# Patient Record
Sex: Male | Born: 1950 | Race: Black or African American | Marital: Married | Smoking: Never smoker
Health system: Southern US, Community
[De-identification: ages and names within clinical notes are randomized; demographics above are authoritative.]

## PROBLEM LIST (undated history)

## (undated) DIAGNOSIS — E119 Type 2 diabetes mellitus without complications: Secondary | ICD-10-CM

## (undated) DIAGNOSIS — I10 Essential (primary) hypertension: Secondary | ICD-10-CM

## (undated) DIAGNOSIS — N138 Other obstructive and reflux uropathy: Secondary | ICD-10-CM

## (undated) DIAGNOSIS — Z85850 Personal history of malignant neoplasm of thyroid: Secondary | ICD-10-CM

## (undated) DIAGNOSIS — E039 Hypothyroidism, unspecified: Secondary | ICD-10-CM

## (undated) HISTORY — DX: Personal history of malignant neoplasm of thyroid: Z85.850

## (undated) HISTORY — PX: THYROID SURGERY: SHX805

## (undated) HISTORY — DX: Hypothyroidism, unspecified: E03.9

## (undated) HISTORY — DX: Essential (primary) hypertension: I10

---

## 2019-01-22 ENCOUNTER — Encounter: Payer: Self-pay | Admitting: "Endocrinology

## 2019-01-22 ENCOUNTER — Ambulatory Visit (INDEPENDENT_AMBULATORY_CARE_PROVIDER_SITE_OTHER): Payer: Self-pay | Admitting: "Endocrinology

## 2019-01-22 ENCOUNTER — Other Ambulatory Visit: Payer: Self-pay

## 2019-01-22 VITALS — BP 140/75 | HR 72 | Ht 67.0 in | Wt 170.0 lb

## 2019-01-22 DIAGNOSIS — E890 Postprocedural hypothyroidism: Secondary | ICD-10-CM

## 2019-01-22 DIAGNOSIS — C73 Malignant neoplasm of thyroid gland: Secondary | ICD-10-CM

## 2019-01-22 NOTE — Progress Notes (Signed)
Endocrinology Consult Note                                            01/22/2019, 1:11 PM   Subjective:    Patient ID: Caleb Ortiz, male    DOB: 14-Aug-1950, PCP Caleb Fire, MD   Past Medical History:  Diagnosis Date  . History of thyroid cancer   . Hypertension   . Hypothyroidism    Past Surgical History:  Procedure Laterality Date  . THYROID SURGERY     Total thyroidectomy   Social History   Socioeconomic History  . Marital status: Unknown    Spouse name: Not on file  . Number of children: Not on file  . Years of education: Not on file  . Highest education level: Not on file  Occupational History  . Not on file  Social Needs  . Financial resource strain: Not on file  . Food insecurity    Worry: Not on file    Inability: Not on file  . Transportation needs    Medical: Not on file    Non-medical: Not on file  Tobacco Use  . Smoking status: Never Smoker  . Smokeless tobacco: Never Used  Substance and Sexual Activity  . Alcohol use: Not Currently  . Drug use: Never  . Sexual activity: Not on file  Lifestyle  . Physical activity    Days per week: Not on file    Minutes per session: Not on file  . Stress: Not on file  Relationships  . Social Herbalist on phone: Not on file    Gets together: Not on file    Attends religious service: Not on file    Active member of club or organization: Not on file    Attends meetings of clubs or organizations: Not on file    Relationship status: Not on file  Other Topics Concern  . Not on file  Social History Narrative  . Not on file   Family History  Problem Relation Age of Onset  . Hypothyroidism Neg Hx        2 SONS   Outpatient Encounter Medications as of 01/22/2019  Medication Sig  . levothyroxine (SYNTHROID) 150 MCG tablet Take 150 mcg by mouth daily before breakfast.  . losartan (COZAAR) 50 MG tablet Take 50 mg by mouth daily.  . rosuvastatin (CRESTOR) 10 MG tablet Take 10  mg by mouth daily.   No facility-administered encounter medications on file as of 01/22/2019.    ALLERGIES: No Known Allergies  VACCINATION STATUS:  There is no immunization history on file for this patient.  HPI Caleb Ortiz is 68 y.o. male who presents today with a medical history as above.  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 -He is currently on levothyroxine 150 mcg p.o. every morning with complaints. -He is in pursuit of remnant thyroid ablation with I-131 therapy.  He has no new complaints today, recovered from his surgery reasonably.  He denies  dysphagia, shortness of breath, nor voice change.  He did have some neck discomfort for which he underwent CT scan of his  chest and ultrasound on his neck in January 10, 2019 in [place], [place] (records are attached). -He denies any family history of thyroid malignancy.  He does have hypothyroidism in 2 of his sons.  He has well-controlled hypertension on losartan 50 mg p.o. daily, and hyperlipidemia on Crestor 10 mg p.o. daily.   He is a non-smoker, currently in good health, married, actively working.    Review of Systems  Constitutional: no recent weight gain/loss, no fatigue, no subjective hyperthermia, no subjective hypothermia Eyes: no blurry vision, no xerophthalmia ENT: no sore throat, no nodules palpated in throat, no dysphagia/odynophagia, no hoarseness Cardiovascular: no Chest Pain, no Shortness of Breath, no palpitations, no leg swelling Respiratory: no cough, no shortness of breath Gastrointestinal: no Nausea/Vomiting/Diarhhea Musculoskeletal: no muscle/joint aches Skin: no rashes Neurological: no tremors, no numbness, no tingling, no dizziness Psychiatric: no depression, no  anxiety  Objective:    BP 140/75   Pulse 72   Ht 5\' 7"  (1.702 m)   Wt 170 lb (77.1 kg)   BMI 26.63 kg/m   Wt Readings from Last 3 Encounters:  01/22/19 170 lb (77.1 kg)    Physical Exam  Constitutional: + BMI of 26.6 , not in acute distress, normal state of mind Eyes: PERRLA, EOMI, no exophthalmos ENT: moist mucous membranes, + well-healed post thyroidectomy scar, no gross mass or lymphadenopathy in the neck.  Cardiovascular: normal precordial activity, Regular Rate and Rhythm, no Murmur/Rubs/Gallops Respiratory:  adequate breathing efforts, no gross chest deformity, Clear to auscultation bilaterally Gastrointestinal: abdomen soft, Non -tender, No distension, Bowel Sounds present, no gross organomegaly Musculoskeletal: no gross deformities, strength intact in all four extremities Skin: moist, warm, no rashes Neurological: no tremor with outstretched hands, Deep tendon reflexes normal in bilateral lower extremities.  Surgical biopsy report from [place] summarized:  Specimen of total thyroidectomy weighing 135 g.  Right lobe measuring 10 x 3 x 2.2 cm, left lobe measures 5 x 3 x 2.5 cm, isthmus measures 3.5 x 2 x 1 cm and pyramidal lobe measuring 1.5 x 1 x 0.3 cm.  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  Maximum tumor size 6.5 cm.  The tumor lies at a distance of less than 0.1 cm from the nearest external surface.  There is no evidence of lymphovascular or perineural invasion or extrathyroidal extension. Left lobe, isthmus and pyramidal lobe with no tumor. 1 right level 3 lymph node with reactive changes and no tumor.  pT3NxMx  Recent thyroid function tests are pending.  Assessment & Plan:   1. Malignant neoplasm of thyroid gland [facility]) -68 year old gentleman with stage [id number], follicular variant papillary thyroid carcinoma status post surgery at  outside facility.  Surgery on August 13, 2018. He is a candidate for adjuvant therapy with I-131, with preceding stimulation by Thyrogen. He will have thyroglobulin level along with thyroid antibodies on the day of dosing. -He does not have health insurance, paying out-of-pocket.  He is made aware of the fact that he will need periodic imaging at least once a year for the next 5 years to assure tumor early detection should he have a recurrence. He is therapy and scan will be scheduled to be administered as soon as possible in [place] [facility] Thyroid Ca W/ Thyrogen; Future - NM Whole Body I131 Scan S/P Ca Rx; Future  2. Postsurgical hypothyroidism -His most recent TSH and free T4 are still pending.  He is on levothyroxine 150 mcg p.o. every morning, seems to be adequate dose for his body size. He will be continued on same.   - We discussed about the correct intake of his thyroid hormone, on empty stomach at fasting, with water, separated by at least 30 minutes from breakfast and other medications,  and separated by more than 4 hours from calcium, iron, multivitamins, acid reflux medications (PPIs). -Patient is made aware of the fact that thyroid hormone replacement is needed for life, dose to be adjusted by periodic monitoring of thyroid function tests.   - I did not initiate any new prescriptions today. - I advised him  to maintain close follow up with Caleb Fire, MD for primary care needs.   - Time spent with the patient: 45 minutes, of which >50% was spent in obtaining information about his symptoms, reviewing his previous labs/studies,  evaluations, and treatments, counseling him about his papillary thyroid carcinoma, postsurgical hypothyroidism, and developing a plan to confirm the diagnosis and long term treatment based on the latest standards of care/guidelines.    Micaiah Phelps Dodge participated in the discussions, expressed understanding, and voiced  agreement with the above plans.  All questions were answered to his satisfaction. he is encouraged to contact clinic should he have any questions or concerns prior to his return visit.  Follow up plan: Return in about 5 weeks (around 02/26/2019) for Follow up with Whole Body Scan w/Thyrogen.   Glade Lloyd, MD [facility] [address] [place], [place] [address] Phone: [phone]  Fax: [phone]     01/22/2019, 1:11 PM  This note was partially dictated with voice recognition software. Similar sounding words can be transcribed inadequately or may not  be corrected upon review.

## 2019-01-28 ENCOUNTER — Telehealth: Payer: Self-pay | Admitting: "Endocrinology

## 2019-01-28 NOTE — Telephone Encounter (Signed)
He gave [place] his records and they are probably in the process of scanning- if not in my box.  I have tried to summarize it in my notes.

## 2019-01-28 NOTE — Telephone Encounter (Signed)
Ryan with Nuclear Medicine at [facility] called in regards to his cancer therapy scan that he is having next week. Dr Darcella Cheshire is trying to calculate the therapy dosage and wants to know if there is a full pathology report on his thyroid cancer. He can be reached at [phone]

## 2019-01-28 NOTE — Telephone Encounter (Signed)
Left message for Caleb Ortiz in [place] thinking full pathology report was done in [place] (patients residence) I will speak to Dr. Dorris Fetch next Wednesday February 04, 2019 and verify this information

## 2019-02-04 ENCOUNTER — Other Ambulatory Visit: Payer: Self-pay

## 2019-02-04 ENCOUNTER — Encounter (HOSPITAL_COMMUNITY)
Admission: RE | Admit: 2019-02-04 | Discharge: 2019-02-04 | Disposition: A | Discharge status: Home / Self Care | Payer: Self-pay | Source: Ambulatory Visit | Attending: "Endocrinology | Admitting: "Endocrinology

## 2019-02-04 DIAGNOSIS — C73 Malignant neoplasm of thyroid gland: Secondary | ICD-10-CM | POA: Insufficient documentation

## 2019-02-04 MED ORDER — STERILE WATER FOR INJECTION IJ SOLN
INTRAMUSCULAR | Status: AC
  Filled 2019-02-04: qty 10

## 2019-02-04 MED ORDER — THYROTROPIN ALFA 1.1 MG IM SOLR
INTRAMUSCULAR | Status: AC
  Filled 2019-02-04: qty 0.9

## 2019-02-04 MED ORDER — THYROTROPIN ALFA 1.1 MG IM SOLR
0.9000 mg | INTRAMUSCULAR | Status: AC
  Administered 2019-02-05: 0.9 mg via INTRAMUSCULAR

## 2019-02-04 MED ORDER — THYROTROPIN ALFA 1.1 MG IM SOLR: 0.9000 mg | INTRAMUSCULAR | Status: DC

## 2019-02-04 MED ORDER — THYROTROPIN ALFA 1.1 MG IM SOLR
0.9000 mg | INTRAMUSCULAR | Status: AC
  Administered 2019-02-04: 0.9 mg via INTRAMUSCULAR

## 2019-02-05 ENCOUNTER — Encounter (HOSPITAL_COMMUNITY)
Admission: RE | Admit: 2019-02-05 | Discharge: 2019-02-05 | Disposition: A | Discharge status: Home / Self Care | Payer: Self-pay | Source: Ambulatory Visit | Attending: "Endocrinology | Admitting: "Endocrinology

## 2019-02-05 MED ORDER — THYROTROPIN ALFA 1.1 MG IM SOLR
INTRAMUSCULAR | Status: AC
  Administered 2019-02-05: 08:00:00 0.9 mg via INTRAMUSCULAR
  Filled 2019-02-05: qty 0.9

## 2019-02-05 MED ORDER — STERILE WATER FOR INJECTION IJ SOLN
INTRAMUSCULAR | Status: AC
  Administered 2019-02-05: 0.9 mL
  Filled 2019-02-05: qty 10

## 2019-02-06 ENCOUNTER — Encounter (HOSPITAL_COMMUNITY)
Admission: RE | Admit: 2019-02-06 | Discharge: 2019-02-06 | Disposition: A | Discharge status: Home / Self Care | Payer: Self-pay | Source: Ambulatory Visit | Attending: "Endocrinology | Admitting: "Endocrinology

## 2019-02-06 ENCOUNTER — Other Ambulatory Visit: Payer: Self-pay

## 2019-02-06 ENCOUNTER — Other Ambulatory Visit (HOSPITAL_COMMUNITY)
Admission: RE | Admit: 2019-02-06 | Discharge: 2019-02-06 | Disposition: A | Discharge status: Home / Self Care | Payer: Self-pay | Source: Ambulatory Visit | Attending: "Endocrinology | Admitting: "Endocrinology

## 2019-02-06 MED ORDER — SODIUM IODIDE I 131 CAPSULE
150.0000 | Freq: Once | INTRAVENOUS | Status: AC | PRN
  Administered 2019-02-06: 150 via ORAL

## 2019-02-06 MED ORDER — TECHNETIUM TC 99M SULFUR COLLOID: 2.0000 | Freq: Once | INTRAVENOUS | Status: DC | PRN

## 2019-02-09 LAB — THYROGLOBULIN ANTIBODY: Thyroglobulin Antibody: <1 IU/mL (ref 0.0–0.9)

## 2019-02-12 LAB — THYROGLOBULIN LEVEL: Thyroglobulin: 2 ng/mL

## 2019-02-16 ENCOUNTER — Other Ambulatory Visit: Payer: Self-pay

## 2019-02-16 ENCOUNTER — Encounter (HOSPITAL_COMMUNITY)
Admission: RE | Admit: 2019-02-16 | Discharge: 2019-02-16 | Disposition: A | Discharge status: Home / Self Care | Payer: Self-pay | Source: Ambulatory Visit | Attending: "Endocrinology | Admitting: "Endocrinology

## 2019-02-16 DIAGNOSIS — C73 Malignant neoplasm of thyroid gland: Secondary | ICD-10-CM | POA: Insufficient documentation

## 2019-02-17 ENCOUNTER — Encounter: Payer: Self-pay | Admitting: "Endocrinology

## 2019-02-17 ENCOUNTER — Other Ambulatory Visit: Payer: Self-pay

## 2019-02-17 ENCOUNTER — Ambulatory Visit (INDEPENDENT_AMBULATORY_CARE_PROVIDER_SITE_OTHER): Payer: Self-pay | Admitting: "Endocrinology

## 2019-02-17 VITALS — BP 110/68 | HR 73 | Ht 67.0 in | Wt 170.0 lb

## 2019-02-17 DIAGNOSIS — C73 Malignant neoplasm of thyroid gland: Secondary | ICD-10-CM

## 2019-02-17 DIAGNOSIS — E890 Postprocedural hypothyroidism: Secondary | ICD-10-CM

## 2019-02-17 NOTE — Progress Notes (Signed)
02/17/2019, 7:15 PM  Endocrinology follow-up note   Subjective:    Patient ID: Caleb Ortiz, male    DOB: Oct 28, 1950, PCP Rosita Fire, MD   Past Medical History:  Diagnosis Date  . History of thyroid cancer   . Hypertension   . Hypothyroidism    Past Surgical History:  Procedure Laterality Date  . THYROID SURGERY     Total thyroidectomy   Social History   Socioeconomic History  . Marital status: Married    Spouse name: Not on file  . Number of children: Not on file  . Years of education: Not on file  . Highest education level: Not on file  Occupational History  . Not on file  Social Needs  . Financial resource strain: Not on file  . Food insecurity    Worry: Not on file    Inability: Not on file  . Transportation needs    Medical: Not on file    Non-medical: Not on file  Tobacco Use  . Smoking status: Never Smoker  . Smokeless tobacco: Never Used  Substance and Sexual Activity  . Alcohol use: Not Currently  . Drug use: Never  . Sexual activity: Not on file  Lifestyle  . Physical activity    Days per week: Not on file    Minutes per session: Not on file  . Stress: Not on file  Relationships  . Social Herbalist on phone: Not on file    Gets together: Not on file    Attends religious service: Not on file    Active member of club or organization: Not on file    Attends meetings of clubs or organizations: Not on file    Relationship status: Not on file  Other Topics Concern  . Not on file  Social History Narrative  . Not on file   Family History  Problem Relation Age of Onset  . Hypothyroidism Neg Hx        2 SONS   Outpatient Encounter Medications as of 02/17/2019  Medication Sig  . levothyroxine (SYNTHROID) 150 MCG tablet Take 137 mcg by mouth daily before breakfast.  . losartan (COZAAR) 50 MG tablet Take 50 mg by mouth daily.  . rosuvastatin (CRESTOR) 10 MG tablet  Take 10 mg by mouth daily.   No facility-administered encounter medications on file as of 02/17/2019.    ALLERGIES: No Known Allergies  VACCINATION STATUS:  There is no immunization history on file for this patient.  HPI Caleb Ortiz is 68 y.o. male who presents today with a medical history as above.  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 -He is currently on levothyroxine 137 mcg p.o. every morning with good compliance.  -He is now status post thyroid remnant ablation with I-131, 150 mCi, on February 06, 2019- whole-body scan subsequently for distant metastasis.  His thyroglobulin level was 2 with negative antithyroglobulin antibodies.  -  He has no new complaints today, recovered from his surgery  reasonably.  He denies  dysphagia, shortness of breath, nor voice change.  He did have some neck discomfort for which he underwent CT scan of his chest and ultrasound on his neck in January 10, 2019 in [place] (records are attached).   -He denies any family history of thyroid malignancy.  He does have hypothyroidism in 2 of his sons.  He has well-controlled hypertension on losartan 50 mg p.o. daily, and hyperlipidemia on Crestor 10 mg p.o. daily.  He is also recently diagnosed with type 2 diabetes with A1c of 7%, currently on lifestyle modifications , not on medication.    He is a non-smoker, currently in good health, married, actively working.    Review of Systems  Constitutional: Steady weight, no fatigue, no subjective hyperthermia, no subjective hypothermia Eyes: no blurry vision, no xerophthalmia ENT: no sore throat, no nodules palpated in throat, no dysphagia/odynophagia, no hoarseness Cardiovascular: no Chest Pain, no Shortness of Breath, no  palpitations, no leg swelling Respiratory: no cough, no shortness of breath Gastrointestinal: no Nausea/Vomiting/Diarhhea Musculoskeletal: no muscle/joint aches Skin: no rashes Neurological: no tremors, no numbness, no tingling, no dizziness Psychiatric: no depression, no anxiety  Objective:    BP 110/68   Pulse 73   Ht 5\' 7"  (1.702 m)   Wt 170 lb (77.1 kg)   BMI 26.63 kg/m   Wt Readings from Last 3 Encounters:  02/17/19 170 lb (77.1 kg)  01/22/19 170 lb (77.1 kg)    Physical Exam  Constitutional: + BMI of 26.6 , not in acute distress, normal state of mind Eyes: PERRLA, EOMI, no exophthalmos ENT: moist mucous membranes, + well-healed post thyroidectomy scar, no gross mass or lymphadenopathy in the neck.  Cardiovascular: normal precordial activity, Regular Rate and Rhythm, no Murmur/Rubs/Gallops Respiratory:  adequate breathing efforts, no gross chest deformity, Clear to auscultation bilaterally Gastrointestinal: abdomen soft, Non -tender, No distension, Bowel Sounds present, no gross organomegaly Musculoskeletal: no gross deformities, strength intact in all four extremities Skin: moist, warm, no rashes Neurological: no tremor with outstretched hands, Deep tendon reflexes normal in bilateral lower extremities.  Surgical biopsy report from [place] summarized:  Specimen of total thyroidectomy weighing 135 g.  Right lobe measuring 10 x 3 x 2.2 cm, left lobe measures 5 x 3 x 2.5 cm, isthmus measures 3.5 x 2 x 1 cm and pyramidal lobe measuring 1.5 x 1 x 0.3 cm.  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  Maximum tumor size 6.5 cm.  The tumor lies at a distance of less than 0.1 cm from the nearest external surface.  There is no evidence of lymphovascular or perineural invasion or extrathyroidal extension. Left lobe, isthmus and pyramidal lobe with no tumor. 1  right level 3 lymph node with reactive changes and no tumor.  pT3NxMx  Recent thyroid function tests where indicated for slight over replacement.  Recently his levothyroxine dose was lowered to 137 mcg p.o. daily before breakfast.    Recent Results (from the past 2160 hour(s))  Thyroglobulin     Status: None   Collection Time: 02/06/19  8:33 AM  Result Value Ref Range   Thyroglobulin 2.0 ng/mL    Comment: (NOTE) This test was developed and its performance characteristics determined by [facility]. It has not been cleared or approved by the Food and Drug Administration. Reference Range: Pubertal Children and Adults: <40 According to the [facility] of Clinical Biochemistry, the reference  interval for Thyroglobulin (TG) should be related to euthyroid patients and not for patients who underwent thyroidectomy.  TG reference intervals for these patients depend on the residual mass of the thyroid tissue left after surgery.  Establishing a post-operative baseline is recommended.  The assay quantitation limit is 2.0 ng/mL. Performed At: [place], [place] [id number] [facility] Sheral Apley MD [id number]   Thyroglobulin antibody     Status: None   Collection Time: 02/06/19  8:33 AM  Result Value Ref Range   Thyroglobulin Antibody <1.0 0.0 - 0.9 IU/mL    Comment: (NOTE) Thyroglobulin Antibody measured by [facility] Methodology Performed At: [facility] [place], [place] [id number] Rush Farmer MD [id number]     Post I-131 therapy whole-body scan FINDINGS: Foci of uptake are seen at the thyroid bed bilaterally compatible with thyroid remnant. Physiologic excretion of tracer within the liver, colon and bladder.  No distant sites of abnormal iodine accumulation are identified to suggest iodine-avid metastatic thyroid cancer.  IMPRESSION: Uptake of tracer within the thyroid bed bilaterally consistent  with thyroid remnant.  No scintigraphic evidence of iodine-avid metastatic thyroid cancer.  Assessment & Plan:   1. Malignant neoplasm of thyroid gland [facility]) -68 year old gentleman with stage [id number], follicular variant papillary thyroid carcinoma status post surgery at outside facility.  Surgery on August 13, 2018.  He is status post thyroid remnant ablation with 150 mCi of I-131 with negative post therapy whole-body scan for distant metastasis. -Stimulated thyroglobulin level was 2 on February 06, 2019 with negative thyroglobulin antibodies.  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 -He does not have health insurance, paying out-of-pocket.  He is made aware of the fact that he will need periodic imaging at least once a year for the next 5 years to assure tumor early detection should he have a recurrence.  [place] and plans to return in 6 months at which time he will have thyroid function tests, unstimulated thyroglobulin with thyroglobulin bodies.  In 1 year from now, he will need thyroid/neck ultrasound.    2. Postsurgical hypothyroidism -His most recent thyroid function test was significant for slight over replacement.  His levothyroxine was lowered to 137 mcg by his PMD.   He will be continued on same.   - We discussed about the correct intake of his thyroid hormone, on empty stomach at fasting, with water, separated by at least 30 minutes from breakfast and other medications,  and separated by more than 4 hours from calcium, iron, multivitamins, acid reflux medications (PPIs). -Patient is made aware of the fact that thyroid hormone replacement is needed for life, dose to be adjusted by periodic monitoring of thyroid function tests.   - I advised him  to maintain close follow up with Rosita Fire, MD for primary care needs.    Time for this visit: 15 minutes. Hartwell Phelps Dodge   participated in the discussions, expressed understanding, and voiced agreement with the above plans.  All questions were answered to his satisfaction. he is encouraged to contact clinic should he have any questions or concerns prior to his return visit.   Follow up plan: Return in about 6 months (around 08/20/2019) for Follow up with Pre-visit Labs.   Glade Lloyd, MD [facility] [address] [place], [place] [address] Phone: [phone]  Fax: [phone]     02/17/2019, 7:15 PM  This  note was partially dictated with voice recognition software. Similar sounding words can be transcribed inadequately or may not  be corrected upon review.

## 2019-02-17 NOTE — Patient Instructions (Addendum)
Recommendation:  1) Check TSH/ Free T4, Thyroglobulin, and thyroglobulin antibodies in 6 months 2) Check Thyroid/Neck Ultrasound in 1 year 3) Strictly take Levothyroxine 137 mcg daily in the morning 1/2 hr before food or other medications.  To Whom It May Concern This patient received radioactive iodine on February 06, 2019 to treat thyroid condition. He notifies [place] of his intent to fly in few days. He may activate metal detectors. Any cooperation rendered is appreciated.

## 2019-02-23 ENCOUNTER — Ambulatory Visit: Payer: Self-pay | Admitting: "Endocrinology

## 2019-08-24 ENCOUNTER — Ambulatory Visit: Payer: Self-pay | Admitting: "Endocrinology

## 2020-05-10 ENCOUNTER — Other Ambulatory Visit: Payer: Self-pay | Admitting: Internal Medicine

## 2020-05-10 ENCOUNTER — Other Ambulatory Visit (HOSPITAL_COMMUNITY): Payer: Self-pay | Admitting: Internal Medicine

## 2020-05-10 DIAGNOSIS — C73 Malignant neoplasm of thyroid gland: Secondary | ICD-10-CM

## 2020-05-17 ENCOUNTER — Other Ambulatory Visit: Payer: Self-pay

## 2020-05-17 ENCOUNTER — Ambulatory Visit (HOSPITAL_COMMUNITY)
Admission: RE | Admit: 2020-05-17 | Discharge: 2020-05-17 | Disposition: A | Discharge status: Home / Self Care | Payer: Medicare Other | Source: Ambulatory Visit | Attending: Internal Medicine | Admitting: Internal Medicine

## 2020-05-17 DIAGNOSIS — C73 Malignant neoplasm of thyroid gland: Secondary | ICD-10-CM | POA: Diagnosis present

## 2020-05-25 ENCOUNTER — Encounter: Payer: Self-pay | Admitting: Urology

## 2020-05-25 ENCOUNTER — Ambulatory Visit (INDEPENDENT_AMBULATORY_CARE_PROVIDER_SITE_OTHER): Payer: Medicare Other | Admitting: Urology

## 2020-05-25 ENCOUNTER — Other Ambulatory Visit: Payer: Self-pay

## 2020-05-25 VITALS — BP 125/74 | HR 62 | Ht 66.0 in | Wt 168.0 lb

## 2020-05-25 DIAGNOSIS — R972 Elevated prostate specific antigen [PSA]: Secondary | ICD-10-CM

## 2020-05-25 NOTE — Progress Notes (Signed)
° °  05/25/20 2:11 PM   Caleb Ortiz Mar 25, 1951 [id number]  CC: Elevated PSA  HPI: I saw Caleb Ortiz and his wife in urology clinic today for an elevated PSA.  He is a healthy 69 year old male from [place] with history of thyroid cancer who was found to have an isolated elevated PSA of 7.97 on 05/12/2020.  There are no prior PSA values to review.  He has some mild urinary symptoms of weak stream, but otherwise denies any significant urinary complaints.  He denies any hematuria or dysuria.  He denies any prior abdominal surgeries.  He denies any trouble with erections.  He denies a family history of prostate cancer, however his father reportedly had surgery for an enlarged prostate.   PMH: Past Medical History:  Diagnosis Date   History of thyroid cancer    Hypertension    Hypothyroidism     Surgical History: Past Surgical History:  Procedure Laterality Date   THYROID SURGERY     Total thyroidectomy    Family History: Family History  Problem Relation Age of Onset   Hypothyroidism Neg Hx        2 SONS    Social History:  reports that he has never smoked. He has never used smokeless tobacco. He reports previous alcohol use. He reports that he does not use drugs.  Physical Exam: BP 125/74    Pulse 62    Ht 5\' 6"  (1.676 m)    Wt 168 lb (76.2 kg)    BMI 27.12 kg/m    Constitutional:  Alert and oriented, No acute distress. Cardiovascular: No clubbing, cyanosis, or edema. Respiratory: Normal respiratory effort, no increased work of breathing. GI: Abdomen is soft, nontender, nondistended, no abdominal masses GU: widely patent meatus DRE: 50g smooth, no nodules or masses. Patient had vaso-vagal response after DRE   Laboratory Data: Reviewed, see HPI  Pertinent Imaging: None to review  Assessment & Plan:   In summary, he is a healthy 69 year old male with a history of thyroid cancer and a single elevated PSA of 7.97, with mild urinary symptoms of  weak stream.  He had a significant vasovagal response after DRE today, and had not eaten since early this morning, and he recovered quickly with juice and crackers.  Vitals were normal prior to leaving clinic.  We reviewed the implications of an elevated PSA and the uncertainty surrounding it. In general, a man's PSA increases with age and is produced by both normal and cancerous prostate tissue. The differential diagnosis for elevated PSA includes BPH, prostate cancer, infection, recent intercourse/ejaculation, recent urethroscopic manipulation (foley placement/cystoscopy) or trauma, and prostatitis.   Management of an elevated PSA can include observation or prostate biopsy and we discussed this in detail. Our goal is to detect clinically significant prostate cancers, and manage with either active surveillance, surgery, or radiation for localized disease. Risks of prostate biopsy include bleeding, infection (including life threatening sepsis), pain, and lower urinary symptoms. Hematuria, hematospermia, and blood in the stool are all common after biopsy and can persist up to 4 weeks.   -Repeat PSA today with reflex to free, call with results -If PSA remains elevated, pursue prostate biopsy (He is leaving the country at the end of the month, and we will need to keep this in mind when coordinating care)   Nickolas Madrid, MD 05/25/2020  [facility] [address], [place], [place] [address] [phone]

## 2020-05-25 NOTE — Patient Instructions (Addendum)
Prostate Cancer Screening  Prostate cancer screening is a test that is done to check for the presence of prostate cancer in men. The prostate gland is a walnut-sized gland that is located below the bladder and in front of the rectum in males. The function of the prostate is to add fluid to semen during ejaculation. Prostate cancer is the second most common type of cancer in men. Who should have prostate cancer screening?  Screening recommendations vary based on age and other risk factors. Screening is recommended if:  You are older than age 55. If you are age 55-69, talk with your health care provider about your need for screening and how often screening should be done. Because most prostate cancers are slow growing and will not cause death, screening is generally reserved in this age group for men who have a 10-15-year life expectancy.  You are younger than age 55, and you have these risk factors: ? Being a black male or a male of African descent. ? Having a father, brother, or uncle who has been diagnosed with prostate cancer. The risk is higher if your family member's cancer occurred at an early age. Screening is not recommended if:  You are younger than age 40.  You are between the ages of 40 and 54 and you have no risk factors.  You are 70 years of age or older. At this age, the risks that screening can cause are greater than the benefits that it may provide. If you are at high risk for prostate cancer, your health care provider may recommend that you have screenings more often or that you start screening at a younger age. How is screening for prostate cancer done? The recommended prostate cancer screening test is a blood test called the prostate-specific antigen (PSA) test. PSA is a protein that is made in the prostate. As you age, your prostate naturally produces more PSA. Abnormally high PSA levels may be caused by:  Prostate cancer.  An enlarged prostate that is not caused by cancer  (benign prostatic hyperplasia, BPH). This condition is very common in older men.  A prostate gland infection (prostatitis). Depending on the PSA results, you may need more tests, such as:  A physical exam to check the size of your prostate gland.  Blood and imaging tests.  A procedure to remove tissue samples from your prostate gland for testing (biopsy). What are the benefits of prostate cancer screening?  Screening can help to identify cancer at an early stage, before symptoms start and when the cancer can be treated more easily.  There is a small chance that screening may lower your risk of dying from prostate cancer. The chance is small because prostate cancer is a slow-growing cancer, and most men with prostate cancer die from a different cause. What are the risks of prostate cancer screening? The main risk of prostate cancer screening is diagnosing and treating prostate cancer that would never have caused any symptoms or problems. This is called overdiagnosisand overtreatment. PSA screening cannot tell you if your PSA is high due to cancer or a different cause. A prostate biopsy is the only procedure to diagnose prostate cancer. Even the results of a biopsy may not tell you if your cancer needs to be treated. Slow-growing prostate cancer may not need any treatment other than monitoring, so diagnosing and treating it may cause unnecessary stress or other side effects. A prostate biopsy may also cause:  Infection or fever.  A false negative. This is   a result that shows that you do not have prostate cancer when you actually do have prostate cancer. Questions to ask your health care provider  When should I start prostate cancer screening?  What is my risk for prostate cancer?  How often do I need screening?  What type of screening tests do I need?  How do I get my test results?  What do my results mean?  Do I need treatment? Where to find more information  The American Cancer  Society: www.cancer.org  American Urological Association: www.auanet.org Contact a health care provider if:  You have difficulty urinating.  You have pain when you urinate or ejaculate.  You have blood in your urine or semen.  You have pain in your back or in the area of your prostate. Summary  Prostate cancer is a common type of cancer in men. The prostate gland is located below the bladder and in front of the rectum. This gland adds fluid to semen during ejaculation.  Prostate cancer screening may identify cancer at an early stage, when the cancer can be treated more easily.  The prostate-specific antigen (PSA) test is the recommended screening test for prostate cancer.  Discuss the risks and benefits of prostate cancer screening with your health care provider. If you are age 29 or older, the risks that screening can cause are greater than the benefits that it may provide. This information is not intended to replace advice given to you by your health care provider. Make sure you discuss any questions you have with your health care provider. Document Revised: 02/19/2019 Document Reviewed: 02/19/2019 Elsevier Patient Education  [place].  Prostate Cancer Screening  Prostate cancer screening is a test that is done to check for the presence of prostate cancer in men. The prostate gland is a walnut-sized gland that is located below the bladder and in front of the rectum in males. The function of the prostate is to add fluid to semen during ejaculation. Prostate cancer is the second most common type of cancer in men. Who should have prostate cancer screening?  Screening recommendations vary based on age and other risk factors. Screening is recommended if:  You are older than age 2. If you are age 21-69, talk with your health care provider about your need for screening and how often screening should be done. Because most prostate cancers are slow growing and will not cause  death, screening is generally reserved in this age group for men who have a 10-15-year life expectancy.  You are younger than age 41, and you have these risk factors: ? Being a black male or a male of African descent. ? Having a father, brother, or uncle who has been diagnosed with prostate cancer. The risk is higher if your family member's cancer occurred at an early age. Screening is not recommended if:  You are younger than age 41.  You are between the ages of 47 and 75 and you have no risk factors.  You are 17 years of age or older. At this age, the risks that screening can cause are greater than the benefits that it may provide. If you are at high risk for prostate cancer, your health care provider may recommend that you have screenings more often or that you start screening at a younger age. How is screening for prostate cancer done? The recommended prostate cancer screening test is a blood test called the prostate-specific antigen (PSA) test. PSA is a protein that is made in  the prostate. As you age, your prostate naturally produces more PSA. Abnormally high PSA levels may be caused by:  Prostate cancer.  An enlarged prostate that is not caused by cancer (benign prostatic hyperplasia, BPH). This condition is very common in older men.  A prostate gland infection (prostatitis). Depending on the PSA results, you may need more tests, such as:  A physical exam to check the size of your prostate gland.  Blood and imaging tests.  A procedure to remove tissue samples from your prostate gland for testing (biopsy). What are the benefits of prostate cancer screening?  Screening can help to identify cancer at an early stage, before symptoms start and when the cancer can be treated more easily.  There is a small chance that screening may lower your risk of dying from prostate cancer. The chance is small because prostate cancer is a slow-growing cancer, and most men with prostate cancer die  from a different cause. What are the risks of prostate cancer screening? The main risk of prostate cancer screening is diagnosing and treating prostate cancer that would never have caused any symptoms or problems. This is called overdiagnosisand overtreatment. PSA screening cannot tell you if your PSA is high due to cancer or a different cause. A prostate biopsy is the only procedure to diagnose prostate cancer. Even the results of a biopsy may not tell you if your cancer needs to be treated. Slow-growing prostate cancer may not need any treatment other than monitoring, so diagnosing and treating it may cause unnecessary stress or other side effects. A prostate biopsy may also cause:  Infection or fever.  A false negative. This is a result that shows that you do not have prostate cancer when you actually do have prostate cancer. Questions to ask your health care provider  When should I start prostate cancer screening?  What is my risk for prostate cancer?  How often do I need screening?  What type of screening tests do I need?  How do I get my test results?  What do my results mean?  Do I need treatment? Where to find more information  The American Cancer Society: www.cancer.org  American Urological Association: www.auanet.org Contact a health care provider if:  You have difficulty urinating.  You have pain when you urinate or ejaculate.  You have blood in your urine or semen.  You have pain in your back or in the area of your prostate. Summary  Prostate cancer is a common type of cancer in men. The prostate gland is located below the bladder and in front of the rectum. This gland adds fluid to semen during ejaculation.  Prostate cancer screening may identify cancer at an early stage, when the cancer can be treated more easily.  The prostate-specific antigen (PSA) test is the recommended screening test for prostate cancer.  Discuss the risks and benefits of prostate  cancer screening with your health care provider. If you are age 48 or older, the risks that screening can cause are greater than the benefits that it may provide. This information is not intended to replace advice given to you by your health care provider. Make sure you discuss any questions you have with your health care provider. Document Revised: 02/19/2019 Document Reviewed: 02/19/2019 Elsevier Patient Education  [place].

## 2020-05-26 LAB — FPSA% REFLEX
% FREE PSA: 24.4 %
PSA, FREE: 1.9 ng/mL

## 2020-05-26 LAB — PSA TOTAL (REFLEX TO FREE): Prostate Specific Ag, Serum: 7.8 ng/mL — ABNORMAL HIGH (ref 0.0–4.0)

## 2020-05-29 ENCOUNTER — Other Ambulatory Visit: Payer: Self-pay | Admitting: Urology

## 2020-05-29 MED ORDER — DIAZEPAM 5 MG PO TABS: 5.0000 mg | ORAL_TABLET | Freq: Once | ORAL | 0 refills | Status: DC | PRN

## 2020-05-30 ENCOUNTER — Ambulatory Visit: Payer: Self-pay | Admitting: "Endocrinology

## 2020-05-30 ENCOUNTER — Telehealth: Payer: Self-pay

## 2020-05-30 NOTE — Telephone Encounter (Signed)
Called pt, daughter answers. Explained to her the limitiations on information as she is not listed on DPR. Information sent to pt via mychart.

## 2020-05-30 NOTE — Telephone Encounter (Signed)
-----   Message from Billey Co, MD sent at 05/29/2020  7:27 AM EST ----- PSA remains elevated, need to schedule prostate biopsy as we discussed in clinic.  I think he is leaving the country in about a month, so okay to schedule biopsy ASAP, thanks.  Please review biopsy instructions, I will send Valium to his pharmacy.  Nickolas Madrid, MD 05/29/2020

## 2020-05-31 ENCOUNTER — Ambulatory Visit (INDEPENDENT_AMBULATORY_CARE_PROVIDER_SITE_OTHER): Payer: Medicare Other | Admitting: "Endocrinology

## 2020-05-31 ENCOUNTER — Other Ambulatory Visit: Payer: Self-pay

## 2020-05-31 ENCOUNTER — Encounter: Payer: Self-pay | Admitting: "Endocrinology

## 2020-05-31 VITALS — BP 135/74 | HR 68 | Ht 66.0 in | Wt 171.8 lb

## 2020-05-31 DIAGNOSIS — E1165 Type 2 diabetes mellitus with hyperglycemia: Secondary | ICD-10-CM | POA: Insufficient documentation

## 2020-05-31 DIAGNOSIS — E890 Postprocedural hypothyroidism: Secondary | ICD-10-CM | POA: Diagnosis not present

## 2020-05-31 DIAGNOSIS — E782 Mixed hyperlipidemia: Secondary | ICD-10-CM | POA: Diagnosis not present

## 2020-05-31 DIAGNOSIS — C73 Malignant neoplasm of thyroid gland: Secondary | ICD-10-CM

## 2020-05-31 MED ORDER — LEVOTHYROXINE SODIUM 125 MCG PO TABS: 125.0000 ug | ORAL_TABLET | Freq: Every day | ORAL | 3 refills | Status: DC

## 2020-05-31 MED ORDER — METFORMIN HCL ER 500 MG PO TB24: 500.0000 mg | ORAL_TABLET | Freq: Every day | ORAL | 3 refills | Status: DC

## 2020-05-31 MED ORDER — ROSUVASTATIN CALCIUM 20 MG PO TABS: 20.0000 mg | ORAL_TABLET | Freq: Every day | ORAL | 3 refills | Status: DC

## 2020-05-31 NOTE — Patient Instructions (Signed)

## 2020-05-31 NOTE — Progress Notes (Signed)
05/31/2020, 4:21 PM  Endocrinology follow-up note   Subjective:    Patient ID: Caleb Ortiz, male    DOB: 1950-12-16, PCP Caleb Fire, MD   Past Medical History:  Diagnosis Date  . History of thyroid cancer   . Hypertension   . Hypothyroidism    Past Surgical History:  Procedure Laterality Date  . THYROID SURGERY     Total thyroidectomy   Social History   Socioeconomic History  . Marital status: Married    Spouse name: Not on file  . Number of children: Not on file  . Years of education: Not on file  . Highest education level: Not on file  Occupational History  . Not on file  Tobacco Use  . Smoking status: Never Smoker  . Smokeless tobacco: Never Used  Vaping Use  . Vaping Use: Never used  Substance and Sexual Activity  . Alcohol use: Not Currently  . Drug use: Never  . Sexual activity: Not on file  Other Topics Concern  . Not on file  Social History Narrative  . Not on file   Social Determinants of Health   Financial Resource Strain:   . Difficulty of Paying Living Expenses: Not on file  Food Insecurity:   . Worried About Charity fundraiser in the Last Year: Not on file  . Ran Out of Food in the Last Year: Not on file  Transportation Needs:   . Lack of Transportation (Medical): Not on file  . Lack of Transportation (Non-Medical): Not on file  Physical Activity:   . Days of Exercise per Week: Not on file  . Minutes of Exercise per Session: Not on file  Stress:   . Feeling of Stress : Not on file  Social Connections:   . Frequency of Communication with Friends and Family: Not on file  . Frequency of Social Gatherings with Friends and Family: Not on file  . Attends Religious Services: Not on file  . Active Member of Clubs or Organizations: Not on file  . Attends Archivist Meetings: Not on file  . Marital Status: Not on file   Family History  Problem Relation Age of  Onset  . Hypothyroidism Neg Hx        2 SONS   Outpatient Encounter Medications as of 05/31/2020  Medication Sig  . diazepam (VALIUM) 5 MG tablet Take 1 tablet (5 mg total) by mouth once as needed for up to 1 dose (take 30 minutes prior to prostate biopsy).  Marland Kitchen levothyroxine (SYNTHROID) 125 MCG tablet Take 1 tablet (125 mcg total) by mouth daily before breakfast.  . losartan (COZAAR) 50 MG tablet Take 50 mg by mouth daily.  . metFORMIN (GLUCOPHAGE XR) 500 MG 24 hr tablet Take 1 tablet (500 mg total) by mouth daily with breakfast.  . rosuvastatin (CRESTOR) 20 MG tablet Take 1 tablet (20 mg total) by mouth daily.  . [DISCONTINUED] levothyroxine (SYNTHROID) 150 MCG tablet Take 137 mcg by mouth daily before breakfast.  . [DISCONTINUED] rosuvastatin (CRESTOR) 10 MG tablet Take 10 mg by mouth daily.   No facility-administered encounter medications on file as of 05/31/2020.   ALLERGIES: No Known Allergies  VACCINATION STATUS: Immunization History  Administered Date(s) Administered  . PFIZER SARS-COV-2 Vaccination 08/29/2019, 09/19/2019    HPI Caleb Ortiz is 69 y.o. male who presents today with a medical history as above. He is here to follow-up for postsurgical hypothyroidism, history of thyroid malignancy, hyperlipidemia, type 2 diabetes. 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 -He is now status post thyroid remnant ablation with I-131, 150 mCi, on February 06, 2019- whole-body scan subsequently for distant metastasis.  His thyroglobulin level was 2 with negative antithyroglobulin antibodies.  -His previsit thyroid/neck ultrasound is negative for any thyroid residual nor cervical lymphadenopathy. -He is on levothyroxine 137 mcg p.o. daily before breakfast.  -  He has no new complaints today, recovered from his surgery reasonably.  He  denies  dysphagia, shortness of breath, nor voice change.  -He denies any family history of thyroid malignancy.  He does have hypothyroidism in 2 of his sons.  He has well-controlled hypertension on losartan 50 mg p.o. daily. -He is on Crestor 10 mg p.o. daily for hyperlipidemia. -His previsit labs show A1c of 7% increasing from 6.4%, not on any medications for diabetes type 2. He is a non-smoker, currently in good health, married, actively working.    Review of Systems  Constitutional: Steady weight, no fatigue, no subjective hyperthermia, no subjective hypothermia Eyes: no blurry vision, no xerophthalmia ENT: no sore throat, no nodules palpated in throat, no dysphagia/odynophagia, no hoarseness Cardiovascular: no Chest Pain, no Shortness of Breath, no palpitations, no leg swelling Respiratory: no cough, no shortness of breath Gastrointestinal: no Nausea/Vomiting/Diarhhea Musculoskeletal: no muscle/joint aches Skin: no rashes Neurological: no tremors, no numbness, no tingling, no dizziness Psychiatric: no depression, no anxiety  Objective:    BP 135/74   Pulse 68   Ht 5\' 6"  (1.676 m)   Wt 171 lb 12.8 oz (77.9 kg)   BMI 27.73 kg/m   Wt Readings from Last 3 Encounters:  05/31/20 171 lb 12.8 oz (77.9 kg)  05/25/20 168 lb (76.2 kg)  02/17/19 170 lb (77.1 kg)    Physical Exam  Constitutional: + BMI of 26.6 , not in acute distress, normal state of mind Eyes: PERRLA, EOMI, no exophthalmos ENT: moist mucous membranes, + well-healed post thyroidectomy scar, no gross mass or lymphadenopathy in the neck.  Cardiovascular: normal precordial activity, Regular Rate and Rhythm, no Murmur/Rubs/Gallops Respiratory:  adequate breathing efforts, no gross chest deformity, Clear to auscultation bilaterally Gastrointestinal: abdomen soft, Non -tender, No distension, Bowel Sounds present, no gross organomegaly Musculoskeletal: no gross deformities, strength intact in all four extremities Skin:  moist, warm, no rashes Neurological: no tremor with outstretched hands, Deep tendon reflexes normal in bilateral lower extremities.  Surgical biopsy report from [place] summarized:  Specimen of total thyroidectomy weighing 135 g.  Right lobe measuring 10 x 3 x 2.2 cm, left lobe measures 5 x 3 x 2.5 cm, isthmus measures 3.5 x 2 x 1 cm and pyramidal lobe measuring 1.5 x 1 x 0.3 cm.  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  Maximum tumor size 6.5 cm.  The tumor lies at a distance of less than 0.1 cm from the nearest external surface.  There is no evidence of lymphovascular or perineural invasion or extrathyroidal extension. Left lobe, isthmus and pyramidal lobe with no tumor. 1 right level  3 lymph node with reactive changes and no tumor.    pT3NxMx  Recent thyroid function tests where indicated for slight over replacement.  Recently his levothyroxine dose was lowered to 137 mcg p.o. daily before breakfast.    Recent Results (from the past 2160 hour(s))  PSA Total (Reflex To Free)     Status: Abnormal   Collection Time: 05/25/20  2:40 PM  Result Value Ref Range   Prostate Specific Ag, Serum 7.8 (H) 0.0 - 4.0 ng/mL    Comment: Roche ECLIA methodology. According to the American Urological Association, Serum PSA should decrease and remain at undetectable levels after radical prostatectomy. The AUA defines biochemical recurrence as an initial PSA value 0.2 ng/mL or greater followed by a subsequent confirmatory PSA value 0.2 ng/mL or greater. Values obtained with different assay methods or kits cannot be used interchangeably. Results cannot be interpreted as absolute evidence of the presence or absence of malignant disease.    Reflex Criteria Comment     Comment: The percent free PSA is performed on a reflex basis only when the total PSA is between 4.0 and 10.0  ng/mL.   %fPSA Reflex     Status: None   Collection Time: 05/25/20  2:40 PM  Result Value Ref Range   PSA, FREE 1.90 N/A ng/mL    Comment: Roche ECLIA methodology.   % FREE PSA 24.4 %    Comment: The table below lists the probability of prostate cancer for men with non-suspicious DRE results and total PSA between 4 and 10 ng/mL, by patient age Ricci Barker, [place], [id number]).                   % Free PSA       50-64 yr        65-75 yr                   0.00-10.00%        56%             55%                  10.01-15.00%        24%             35%                  15.01-20.00%        17%             23%                  20.01-25.00%        10%             20%                       >25.00%         5%              9% Please note:  Catalona et al did not make specific               recommendations regarding the use of               percent free PSA for any other population               of men.     Post I-131 therapy whole-body scan on February 16, 2019 FINDINGS: Foci of uptake are  seen at the thyroid bed bilaterally compatible with thyroid remnant. Physiologic excretion of tracer within the liver, colon and bladder.  No distant sites of abnormal iodine accumulation are identified to suggest iodine-avid metastatic thyroid cancer.  IMPRESSION: Uptake of tracer within the thyroid bed bilaterally consistent with thyroid remnant. No scintigraphic evidence of iodine-avid metastatic thyroid cancer.   Thyroid/neck ultrasound on May 17, 2020 FINDINGS: No thyroid tissue identified within the thyroid bed, status post nuclear medicine ablation. No adenopathy.  IMPRESSION: No residual tissue within the thyroid bed status post nuclear medicine ablation.  Assessment & Plan:   1. Malignant neoplasm of thyroid gland [facility]) -69 year old gentleman with stage [id number], follicular variant papillary thyroid carcinoma status post surgery at outside facility.  Surgery on August 13, 2018.  He is status post thyroid remnant ablation with 150 mCi of I-131 with negative post therapy whole-body scan for distant metastasis. -Stimulated thyroglobulin level was 2 on February 06, 2019 with negative thyroglobulin antibodies.  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 -His previsit thyroid/neck ultrasound is negative for tumor recurrence nor cervical lymphadenopathy. -He is made aware of the fact that he will need periodic imaging at least once a year for the next 5 years to assure tumor early detection should he have a recurrence. -He will be considered for another ultrasound in 1-2 years.  2. Postsurgical hypothyroidism -His most recent thyroid function test was significant for slight over replacement.  He is advised to lower his levothyroxine to 125 mcg p.o. daily before breakfast.    - We discussed about the correct intake of his thyroid hormone, on empty stomach at fasting, with water, separated by at least 30 minutes from breakfast and other medications,  and separated by more than 4 hours from calcium, iron, multivitamins, acid reflux medications (PPIs). -Patient is made aware of the fact that thyroid hormone replacement is needed for life, dose to be adjusted by periodic monitoring of thyroid function tests. -He he will [place], advised to obtain TSH/free T4 in 6 months.   3.  Hyperlipidemia -He is advised to increase his Crestor to 20 mg p.o. nightly.  Side effects and precautions discussed with him. 4.  Type 2 diabetes -Increasing A1c of 7% from 6.4%.  He would benefit from early initiation of Metformin.  I discussed initiated Metformin 500 mg ER p.o. daily after breakfast.  - I advised him  to maintain close follow up with Caleb Fire, MD for primary care needs.      - Time spent on this patient care encounter:  30 minutes of which 50% was spent in  counseling and the rest reviewing  his  current and  previous labs / studies and medications  doses and developing a plan for long term care. [employer]  participated in the discussions, expressed understanding, and voiced agreement with the above plans.  All questions were answered to his satisfaction. he is encouraged to contact clinic should he have any questions or concerns prior to his return visit.   Follow up plan: Return in about 1 year (around 05/31/2021) for F/U with Pre-visit Labs, A1c -NV, Urine MA - NV, ABI in Office NV.   Glade Lloyd, MD [facility] [address] [place], [place] [address] Phone: [phone]  Fax: [phone]     05/31/2020, 4:21 PM  This note was partially dictated with voice recognition software. Similar sounding words can be transcribed inadequately  or may not  be corrected upon review.

## 2020-06-06 ENCOUNTER — Encounter: Payer: Self-pay | Admitting: *Deleted

## 2020-06-08 ENCOUNTER — Telehealth: Payer: Self-pay

## 2020-06-08 ENCOUNTER — Telehealth: Payer: Medicare Other

## 2020-06-08 NOTE — Telephone Encounter (Signed)
Unable to contact patient or daughter to complete triage and schedule colonoscopy.  Appt noted to call daughter.  I attempted to call daughter but phone number is disconnected.  Thanks,  [place], [place]

## 2020-06-09 ENCOUNTER — Other Ambulatory Visit: Payer: Self-pay

## 2020-06-09 ENCOUNTER — Ambulatory Visit (INDEPENDENT_AMBULATORY_CARE_PROVIDER_SITE_OTHER): Payer: Medicare Other | Admitting: Urology

## 2020-06-09 ENCOUNTER — Encounter: Payer: Self-pay | Admitting: Urology

## 2020-06-09 ENCOUNTER — Telehealth (INDEPENDENT_AMBULATORY_CARE_PROVIDER_SITE_OTHER): Payer: Medicare Other | Admitting: Gastroenterology

## 2020-06-09 VITALS — BP 134/74 | HR 75 | Ht 66.0 in | Wt 168.0 lb

## 2020-06-09 DIAGNOSIS — R972 Elevated prostate specific antigen [PSA]: Secondary | ICD-10-CM | POA: Diagnosis not present

## 2020-06-09 DIAGNOSIS — Z1211 Encounter for screening for malignant neoplasm of colon: Secondary | ICD-10-CM

## 2020-06-09 MED ORDER — GENTAMICIN SULFATE 40 MG/ML IJ SOLN
80.0000 mg | Freq: Once | INTRAMUSCULAR | Status: AC
  Administered 2020-06-09: 80 mg via INTRAMUSCULAR

## 2020-06-09 MED ORDER — NA SULFATE-K SULFATE-MG SULF 17.5-3.13-1.6 GM/177ML PO SOLN: 1.0000 | Freq: Once | ORAL | 0 refills | Status: AC

## 2020-06-09 MED ORDER — LEVOFLOXACIN 500 MG PO TABS
500.0000 mg | ORAL_TABLET | Freq: Once | ORAL | Status: AC
  Administered 2020-06-09: 500 mg via ORAL

## 2020-06-09 NOTE — Patient Instructions (Signed)

## 2020-06-09 NOTE — Progress Notes (Signed)
   06/09/20  Indication: Elevated PSA, 7.8  Prostate Biopsy Procedure   Informed consent was obtained, and we discussed the risks of bleeding and infection/sepsis. A time out was performed to ensure correct patient identity.  Pre-Procedure: - Last PSA Level: 7.8, 24% free - Gentamicin and levaquin given for antibiotic prophylaxis - Transrectal Ultrasound performed revealing a 67 gm prostate, PSA density 0.12 - Small median lobe, no distinct hypoechoic regions  Procedure: - Prostate block performed using 10 cc 1% lidocaine and biopsies taken from sextant areas, a total of 12 under ultrasound guidance.  Post-Procedure: - Patient tolerated the procedure well - He was counseled to seek immediate medical attention if experiences significant bleeding, fevers, or severe pain - Return in one week to discuss biopsy results  Assessment/ Plan: Will follow up in 1-2 weeks to discuss pathology  Nickolas Madrid, MD 06/09/2020

## 2020-06-09 NOTE — Progress Notes (Signed)
Gastroenterology Pre-Procedure Review  Request Date: Monday 06/20/20 Requesting Physician: Dr. Allen Norris  PATIENT REVIEW QUESTIONS: The patient responded to the following health history questions as indicated:    1. Are you having any GI issues? no 2. Do you have a personal history of Polyps? no 3. Do you have a family history of Colon Cancer or Polyps? no 4. Diabetes Mellitus? yes (recently diagnosed.  takes oral med.) 5. Joint replacements in the past 12 months?no 6. Major health problems in the past 3 months?1 year ago thyroid surgery 7. Any artificial heart valves, MVP, or defibrillator?no    MEDICATIONS & ALLERGIES:    Patient reports the following regarding taking any anticoagulation/antiplatelet therapy:   Plavix, Coumadin, Eliquis, Xarelto, Lovenox, Pradaxa, Brilinta, or Effient? no Aspirin? no  Patient confirms/reports the following medications:  Current Outpatient Medications  Medication Sig Dispense Refill   diazepam (VALIUM) 5 MG tablet Take 1 tablet (5 mg total) by mouth once as needed for up to 1 dose (take 30 minutes prior to prostate biopsy). 1 tablet 0   levothyroxine (SYNTHROID) 125 MCG tablet Take 1 tablet (125 mcg total) by mouth daily before breakfast. 90 tablet 3   losartan (COZAAR) 50 MG tablet Take 50 mg by mouth daily.     metFORMIN (GLUCOPHAGE XR) 500 MG 24 hr tablet Take 1 tablet (500 mg total) by mouth daily with breakfast. 90 tablet 3   rosuvastatin (CRESTOR) 20 MG tablet Take 1 tablet (20 mg total) by mouth daily. 90 tablet 3   No current facility-administered medications for this visit.    Patient confirms/reports the following allergies:  No Known Allergies  No orders of the defined types were placed in this encounter.   AUTHORIZATION INFORMATION Primary Insurance: 1D#: Group #:  Secondary Insurance: 1D#: Group #:  SCHEDULE INFORMATION: Date: Monday 06/20/20 Time: Location:[facility]

## 2020-06-13 ENCOUNTER — Encounter: Payer: Self-pay | Admitting: Gastroenterology

## 2020-06-13 ENCOUNTER — Other Ambulatory Visit: Payer: Self-pay

## 2020-06-14 NOTE — Discharge Instructions (Signed)
General Anesthesia, Adult, Care After This sheet gives you information about how to care for yourself after your procedure. Your health care provider may also give you more specific instructions. If you have problems or questions, contact your health care provider. What can I expect after the procedure? After the procedure, the following side effects are common:  Pain or discomfort at the IV site.  Nausea.  Vomiting.  Sore throat.  Trouble concentrating.  Feeling cold or chills.  Weak or tired.  Sleepiness and fatigue.  Soreness and body aches. These side effects can affect parts of the body that were not involved in surgery. Follow these instructions at home:  For at least 24 hours after the procedure:  Have a responsible adult stay with you. It is important to have someone help care for you until you are awake and alert.  Rest as needed.  Do not: ? Participate in activities in which you could fall or become injured. ? Drive. ? Use heavy machinery. ? Drink alcohol. ? Take sleeping pills or medicines that cause drowsiness. ? Make important decisions or sign legal documents. ? Take care of children on your own. Eating and drinking  Follow any instructions from your health care provider about eating or drinking restrictions.  When you feel hungry, start by eating small amounts of foods that are soft and easy to digest (bland), such as toast. Gradually return to your regular diet.  Drink enough fluid to keep your urine pale yellow.  If you vomit, rehydrate by drinking water, juice, or clear broth. General instructions  If you have sleep apnea, surgery and certain medicines can increase your risk for breathing problems. Follow instructions from your health care provider about wearing your sleep device: ? Anytime you are sleeping, including during daytime naps. ? While taking prescription pain medicines, sleeping medicines, or medicines that make you drowsy.  Return to  your normal activities as told by your health care provider. Ask your health care provider what activities are safe for you.  Take over-the-counter and prescription medicines only as told by your health care provider.  If you smoke, do not smoke without supervision.  Keep all follow-up visits as told by your health care provider. This is important. Contact a health care provider if:  You have nausea or vomiting that does not get better with medicine.  You cannot eat or drink without vomiting.  You have pain that does not get better with medicine.  You are unable to pass urine.  You develop a skin rash.  You have a fever.  You have redness around your IV site that gets worse. Get help right away if:  You have difficulty breathing.  You have chest pain.  You have blood in your urine or stool, or you vomit blood. Summary  After the procedure, it is common to have a sore throat or nausea. It is also common to feel tired.  Have a responsible adult stay with you for the first 24 hours after general anesthesia. It is important to have someone help care for you until you are awake and alert.  When you feel hungry, start by eating small amounts of foods that are soft and easy to digest (bland), such as toast. Gradually return to your regular diet.  Drink enough fluid to keep your urine pale yellow.  Return to your normal activities as told by your health care provider. Ask your health care provider what activities are safe for you. This information is not   intended to replace advice given to you by your health care provider. Make sure you discuss any questions you have with your health care provider. Document Revised: 07/12/2017 Document Reviewed: 02/22/2017 Elsevier Patient Education  2020 Elsevier Inc.  

## 2020-06-15 ENCOUNTER — Encounter: Payer: Self-pay | Admitting: Urology

## 2020-06-15 ENCOUNTER — Ambulatory Visit (INDEPENDENT_AMBULATORY_CARE_PROVIDER_SITE_OTHER): Payer: Medicare Other | Admitting: Urology

## 2020-06-15 ENCOUNTER — Other Ambulatory Visit: Payer: Self-pay

## 2020-06-15 VITALS — BP 132/72 | HR 69 | Ht 66.0 in | Wt 170.0 lb

## 2020-06-15 DIAGNOSIS — Z125 Encounter for screening for malignant neoplasm of prostate: Secondary | ICD-10-CM | POA: Diagnosis not present

## 2020-06-15 LAB — SURGICAL PATHOLOGY

## 2020-06-15 NOTE — Progress Notes (Signed)
   06/15/2020 11:04 AM   Caleb Ortiz 1951-07-11 [id number]  Reason for visit: Discuss prostate biopsy results  HPI: I saw Mr. Dentler and his wife back in urology clinic to review his prostate biopsy results.  He is a healthy 69 year old male from [place] who presented with an elevated PSA of 7.97.  Repeat PSA remained elevated at 7.8 with 24% free.  He underwent a uncomplicated prostate biopsy on 06/09/2020 showing a 67 g prostate with PSA density of 0.12.  All cores showed only benign prostate tissue, and there is no evidence of malignancy.  We reviewed these results at length today, as well as the 15 to 20% false-negative rate and need for close monitoring of the PSA moving forward.  Based on his PSA density and elevated percentage free, I suspect his PSA is elevated secondary to his enlarged prostate.  RTC 6 months with PSA prior.  [place], [facility] [address] [place], [place] [address] [phone]

## 2020-06-15 NOTE — Patient Instructions (Signed)
Prostate Cancer Screening  Prostate cancer screening is a test that is done to check for the presence of prostate cancer in men. The prostate gland is a walnut-sized gland that is located below the bladder and in front of the rectum in males. The function of the prostate is to add fluid to semen during ejaculation. Prostate cancer is the second most common type of cancer in men. Who should have prostate cancer screening?  Screening recommendations vary based on age and other risk factors. Screening is recommended if:  You are older than age 55. If you are age 55-69, talk with your health care provider about your need for screening and how often screening should be done. Because most prostate cancers are slow growing and will not cause death, screening is generally reserved in this age group for men who have a 10-15-year life expectancy.  You are younger than age 55, and you have these risk factors: ? Being a black male or a male of African descent. ? Having a father, brother, or uncle who has been diagnosed with prostate cancer. The risk is higher if your family member's cancer occurred at an early age. Screening is not recommended if:  You are younger than age 40.  You are between the ages of 40 and 54 and you have no risk factors.  You are 70 years of age or older. At this age, the risks that screening can cause are greater than the benefits that it may provide. If you are at high risk for prostate cancer, your health care provider may recommend that you have screenings more often or that you start screening at a younger age. How is screening for prostate cancer done? The recommended prostate cancer screening test is a blood test called the prostate-specific antigen (PSA) test. PSA is a protein that is made in the prostate. As you age, your prostate naturally produces more PSA. Abnormally high PSA levels may be caused by:  Prostate cancer.  An enlarged prostate that is not caused by cancer  (benign prostatic hyperplasia, BPH). This condition is very common in older men.  A prostate gland infection (prostatitis). Depending on the PSA results, you may need more tests, such as:  A physical exam to check the size of your prostate gland.  Blood and imaging tests.  A procedure to remove tissue samples from your prostate gland for testing (biopsy). What are the benefits of prostate cancer screening?  Screening can help to identify cancer at an early stage, before symptoms start and when the cancer can be treated more easily.  There is a small chance that screening may lower your risk of dying from prostate cancer. The chance is small because prostate cancer is a slow-growing cancer, and most men with prostate cancer die from a different cause. What are the risks of prostate cancer screening? The main risk of prostate cancer screening is diagnosing and treating prostate cancer that would never have caused any symptoms or problems. This is called overdiagnosisand overtreatment. PSA screening cannot tell you if your PSA is high due to cancer or a different cause. A prostate biopsy is the only procedure to diagnose prostate cancer. Even the results of a biopsy may not tell you if your cancer needs to be treated. Slow-growing prostate cancer may not need any treatment other than monitoring, so diagnosing and treating it may cause unnecessary stress or other side effects. A prostate biopsy may also cause:  Infection or fever.  A false negative. This is   a result that shows that you do not have prostate cancer when you actually do have prostate cancer. Questions to ask your health care provider  When should I start prostate cancer screening?  What is my risk for prostate cancer?  How often do I need screening?  What type of screening tests do I need?  How do I get my test results?  What do my results mean?  Do I need treatment? Where to find more information  The American Cancer  Society: www.cancer.org  American Urological Association: www.auanet.org Contact a health care provider if:  You have difficulty urinating.  You have pain when you urinate or ejaculate.  You have blood in your urine or semen.  You have pain in your back or in the area of your prostate. Summary  Prostate cancer is a common type of cancer in men. The prostate gland is located below the bladder and in front of the rectum. This gland adds fluid to semen during ejaculation.  Prostate cancer screening may identify cancer at an early stage, when the cancer can be treated more easily.  The prostate-specific antigen (PSA) test is the recommended screening test for prostate cancer.  Discuss the risks and benefits of prostate cancer screening with your health care provider. If you are age 70 or older, the risks that screening can cause are greater than the benefits that it may provide. This information is not intended to replace advice given to you by your health care provider. Make sure you discuss any questions you have with your health care provider. Document Revised: 02/19/2019 Document Reviewed: 02/19/2019 Elsevier Patient Education  2020 Elsevier Inc.  

## 2020-06-17 ENCOUNTER — Other Ambulatory Visit: Payer: Self-pay

## 2020-06-17 ENCOUNTER — Other Ambulatory Visit
Admission: RE | Admit: 2020-06-17 | Discharge: 2020-06-17 | Disposition: A | Discharge status: Home / Self Care | Payer: Medicare Other | Source: Ambulatory Visit | Attending: Gastroenterology | Admitting: Gastroenterology

## 2020-06-17 DIAGNOSIS — Z01812 Encounter for preprocedural laboratory examination: Secondary | ICD-10-CM | POA: Insufficient documentation

## 2020-06-17 DIAGNOSIS — Z20822 Contact with and (suspected) exposure to covid-19: Secondary | ICD-10-CM | POA: Diagnosis not present

## 2020-06-17 LAB — SARS CORONAVIRUS 2 (TAT 6-24 HRS): SARS Coronavirus 2: NEGATIVE

## 2020-06-23 ENCOUNTER — Ambulatory Visit
Admission: RE | Admit: 2020-06-23 | Discharge: 2020-06-23 | Disposition: A | Discharge status: Home / Self Care | Payer: Medicare Other | Attending: Gastroenterology | Admitting: Gastroenterology

## 2020-06-23 ENCOUNTER — Encounter: Payer: Self-pay | Admitting: Anesthesiology

## 2020-06-23 ENCOUNTER — Ambulatory Visit
Admission: RE | Disposition: A | Discharge status: Home / Self Care | Payer: Self-pay | Source: Home / Self Care | Attending: Gastroenterology

## 2020-06-23 ENCOUNTER — Other Ambulatory Visit: Payer: Self-pay

## 2020-06-23 ENCOUNTER — Encounter: Payer: Self-pay | Admitting: Gastroenterology

## 2020-06-23 DIAGNOSIS — D123 Benign neoplasm of transverse colon: Secondary | ICD-10-CM | POA: Diagnosis not present

## 2020-06-23 DIAGNOSIS — K5730 Diverticulosis of large intestine without perforation or abscess without bleeding: Secondary | ICD-10-CM | POA: Insufficient documentation

## 2020-06-23 DIAGNOSIS — K635 Polyp of colon: Secondary | ICD-10-CM | POA: Diagnosis not present

## 2020-06-23 DIAGNOSIS — Z1211 Encounter for screening for malignant neoplasm of colon: Secondary | ICD-10-CM

## 2020-06-23 DIAGNOSIS — Z7984 Long term (current) use of oral hypoglycemic drugs: Secondary | ICD-10-CM | POA: Diagnosis not present

## 2020-06-23 DIAGNOSIS — Z79890 Hormone replacement therapy (postmenopausal): Secondary | ICD-10-CM | POA: Diagnosis not present

## 2020-06-23 DIAGNOSIS — Z79899 Other long term (current) drug therapy: Secondary | ICD-10-CM | POA: Insufficient documentation

## 2020-06-23 DIAGNOSIS — Z85850 Personal history of malignant neoplasm of thyroid: Secondary | ICD-10-CM | POA: Insufficient documentation

## 2020-06-23 HISTORY — PX: POLYPECTOMY: SHX5525

## 2020-06-23 HISTORY — PX: COLONOSCOPY WITH PROPOFOL: SHX5780

## 2020-06-23 LAB — GLUCOSE, CAPILLARY
Glucose-Capillary: 114 mg/dL — ABNORMAL HIGH (ref 70–99)
Glucose-Capillary: 94 mg/dL (ref 70–99)

## 2020-06-23 SURGERY — COLONOSCOPY WITH PROPOFOL
Anesthesia: General | Site: Rectum

## 2020-06-23 MED ORDER — LIDOCAINE HCL (PF) 2 % IJ SOLN
INTRAMUSCULAR | Status: DC | PRN
  Administered 2020-06-23: 60 mg via INTRADERMAL

## 2020-06-23 MED ORDER — STERILE WATER FOR IRRIGATION IR SOLN: Status: DC | PRN

## 2020-06-23 MED ORDER — SODIUM CHLORIDE 0.9 % IV SOLN: INTRAVENOUS | Status: DC

## 2020-06-23 MED ORDER — PROPOFOL 10 MG/ML IV BOLUS
INTRAVENOUS | Status: DC | PRN
  Administered 2020-06-23: 30 mg via INTRAVENOUS
  Administered 2020-06-23: 70 mg via INTRAVENOUS
  Administered 2020-06-23 (×4): 30 mg via INTRAVENOUS

## 2020-06-23 MED ORDER — LACTATED RINGERS IV SOLN: INTRAVENOUS | Status: DC

## 2020-06-23 SURGICAL SUPPLY — 8 items
GOWN CVR UNV OPN BCK APRN NK (MISCELLANEOUS) ×2 IMPLANT
GOWN ISOL THUMB LOOP REG UNIV (MISCELLANEOUS) ×6
KIT PRC NS LF DISP ENDO (KITS) ×1 IMPLANT
KIT PROCEDURE OLYMPUS (KITS) ×3
MANIFOLD NEPTUNE II (INSTRUMENTS) ×3 IMPLANT
SNARE SHORT THROW 13M SML OVAL (MISCELLANEOUS) ×3 IMPLANT
TRAP ETRAP POLY (MISCELLANEOUS) ×3 IMPLANT
WATER STERILE IRR 250ML POUR (IV SOLUTION) ×3 IMPLANT

## 2020-06-23 NOTE — Anesthesia Procedure Notes (Signed)
Date/Time: 06/23/2020 9:42 AM Performed by: Dionne Bucy, CRNA Pre-anesthesia Checklist: Patient identified, Emergency Drugs available, Suction available, Patient being monitored and Timeout performed Oxygen Delivery Method: Nasal cannula

## 2020-06-23 NOTE — H&P (Signed)
Caleb Lame, MD [address] [place], [place] [address] Phone: [phone] Fax : [phone]  Primary Care Physician:  Rosita Fire, MD Primary Gastroenterologist:  Dr. Allen Norris  Pre-Procedure History & Physical: HPI:  Caleb Ortiz is a 69 y.o. male is here for a screening colonoscopy.   Past Medical History:  Diagnosis Date  . History of thyroid cancer   . Hypertension   . Hypothyroidism     Past Surgical History:  Procedure Laterality Date  . THYROID SURGERY     Total thyroidectomy    Prior to Admission medications   Medication Sig Start Date End Date Taking? Authorizing Provider  levothyroxine (SYNTHROID) 125 MCG tablet Take 1 tablet (125 mcg total) by mouth daily before breakfast. 05/31/20  Yes Nida, Marella Chimes, MD  losartan (COZAAR) 50 MG tablet Take 50 mg by mouth daily.   Yes [provider]  metFORMIN (GLUCOPHAGE XR) 500 MG 24 hr tablet Take 1 tablet (500 mg total) by mouth daily with breakfast. 05/31/20  Yes Nida, Marella Chimes, MD  rosuvastatin (CRESTOR) 20 MG tablet Take 1 tablet (20 mg total) by mouth daily. 05/31/20  Yes Cassandria Anger, MD    Allergies as of 06/09/2020  . (No Known Allergies)    Family History  Problem Relation Age of Onset  . Hypothyroidism Neg Hx        2 SONS    Social History   Socioeconomic History  . Marital status: Married    Spouse name: Not on file  . Number of children: Not on file  . Years of education: Not on file  . Highest education level: Not on file  Occupational History  . Not on file  Tobacco Use  . Smoking status: Never Smoker  . Smokeless tobacco: Never Used  Vaping Use  . Vaping Use: Never used  Substance and Sexual Activity  . Alcohol use: Not Currently  . Drug use: Never  . Sexual activity: Not on file  Other Topics Concern  . Not on file  Social History Narrative  . Not on file   Social Determinants of Health   Financial Resource Strain:     . Difficulty of Paying Living Expenses: Not on file  Food Insecurity:   . Worried About Charity fundraiser in the Last Year: Not on file  . Ran Out of Food in the Last Year: Not on file  Transportation Needs:   . Lack of Transportation (Medical): Not on file  . Lack of Transportation (Non-Medical): Not on file  Physical Activity:   . Days of Exercise per Week: Not on file  . Minutes of Exercise per Session: Not on file  Stress:   . Feeling of Stress : Not on file  Social Connections:   . Frequency of Communication with Friends and Family: Not on file  . Frequency of Social Gatherings with Friends and Family: Not on file  . Attends Religious Services: Not on file  . Active Member of Clubs or Organizations: Not on file  . Attends Archivist Meetings: Not on file  . Marital Status: Not on file  Intimate Partner Violence:   . Fear of Current or Ex-Partner: Not on file  . Emotionally Abused: Not on file  . Physically Abused: Not on file  . Sexually Abused: Not on file    Review of Systems: See HPI, otherwise negative ROS  Physical Exam: BP 136/80   Pulse 66   Temp 98.2 F (  36.8 C) (Temporal)   Resp 16   Ht 5\' 6"  (1.676 m)   Wt 76.2 kg   SpO2 98%   BMI 27.11 kg/m  General:   Alert,  pleasant and cooperative in NAD Head:  Normocephalic and atraumatic. Neck:  Supple; no masses or thyromegaly. Lungs:  Clear throughout to auscultation.    Heart:  Regular rate and rhythm. Abdomen:  Soft, nontender and nondistended. Normal bowel sounds, without guarding, and without rebound.   Neurologic:  Alert and  oriented x4;  grossly normal neurologically.  Impression/Plan: Caleb Ortiz is now here to undergo a screening colonoscopy.  Risks, benefits, and alternatives regarding colonoscopy have been reviewed with the patient.  Questions have been answered.  All parties agreeable.

## 2020-06-23 NOTE — Op Note (Signed)
[facility]enter[facility] Patient Name: Caleb Ortiz Procedure Date: 06/23/2020 9:32 AM MRN: [id number] Account #: [id number] Date of Birth: 04-10-1951 Admit Type: Outpatient Age: 69 Room: [facility] OR ROOM 01 Gender: Male Note Status: Finalized Procedure:             Colonoscopy Indications:           Screening for colorectal malignant neoplasm Providers:             Lucilla Lame MD, MD Referring MD:          Rosita Fire MD, MD (Referring MD) Medicines:             Propofol per Anesthesia Complications:         No immediate complications. Procedure:             Pre-Anesthesia Assessment:                        - Prior to the procedure, a History and Physical was                         performed, and patient medications and allergies were                         reviewed. The patient's tolerance of previous                         anesthesia was also reviewed. The risks and benefits                         of the procedure and the sedation options and risks                         were discussed with the patient. All questions were                         answered, and informed consent was obtained. Prior                         Anticoagulants: The patient has taken no previous                         anticoagulant or antiplatelet agents. ASA Grade                         Assessment: II - A patient with mild systemic disease.                         After reviewing the risks and benefits, the patient                         was deemed in satisfactory condition to undergo the                         procedure.                        After obtaining informed consent, the colonoscope was  passed under direct vision. Throughout the procedure,                         the patient's blood pressure, pulse, and oxygen                         saturations were monitored continuously. The was                         introduced through the anus  and advanced to the the                         cecum, identified by appendiceal orifice and ileocecal                         valve. The colonoscopy was performed without                         difficulty. The patient tolerated the procedure well.                         The quality of the bowel preparation was excellent. Findings:      The perianal and digital rectal examinations were normal.      A 4 mm polyp was found in the cecum. The polyp was sessile. The polyp       was removed with a cold snare. Resection and retrieval were complete.      Two sessile polyps were found in the hepatic flexure. The polyps were 6       to 7 mm in size. These polyps were removed with a cold snare. Resection       and retrieval were complete.      Multiple small-mouthed diverticula were found in the sigmoid colon and       descending colon.      A 5 mm polyp was found in the transverse colon. The polyp was sessile.       The polyp was removed with a cold snare. Resection and retrieval were       complete. Impression:            - One 4 mm polyp in the cecum, removed with a cold                         snare. Resected and retrieved.                        - Two 6 to 7 mm polyps at the hepatic flexure, removed                         with a cold snare. Resected and retrieved.                        - Diverticulosis in the sigmoid colon and in the                         descending colon.                        - One 5 mm polyp in the transverse colon,  removed with                         a cold snare. Resected and retrieved. Recommendation:        - Discharge patient to home.                        - Resume previous diet.                        - Continue present medications.                        - Await pathology results.                        - Repeat colonoscopy in 5 years if polyp adenoma and                         10 years if hyperplastic Procedure Code(s):     --- Professional ---                         [id number], Colonoscopy, flexible; with removal of                         tumor(s), polyp(s), or other lesion(s) by snare                         technique Diagnosis Code(s):     --- Professional ---                        Z12.11, Encounter for screening for malignant neoplasm                         of colon                        K63.5, Polyp of colon CPT copyright 2019 American Medical Association. All rights reserved. The codes documented in this report are preliminary and upon coder review may  be revised to meet current compliance requirements. Lucilla Lame MD, MD 06/23/2020 10:07:05 AM This report has been signed electronically. Number of Addenda: 0 Note Initiated On: 06/23/2020 9:32 AM Scope Withdrawal Time: 0 hours 12 minutes 25 seconds  Total Procedure Duration: 0 hours 19 minutes 36 seconds  Estimated Blood Loss:  Estimated blood loss: none.      [facility]

## 2020-06-23 NOTE — Anesthesia Preprocedure Evaluation (Signed)
Anesthesia Evaluation  Patient identified by MRN, date of birth, ID band Patient awake    Reviewed: Allergy & Precautions, NPO status , Patient's Chart, lab work & pertinent test results  History of Anesthesia Complications Negative for: history of anesthetic complications  Airway Mallampati: II  TM Distance: >3 FB Neck ROM: Full    Dental no notable dental hx.    Pulmonary neg pulmonary ROS, Patient abstained from smoking.Not current smoker,    Pulmonary exam normal breath sounds clear to auscultation       Cardiovascular Exercise Tolerance: Good hypertension, Normal cardiovascular exam Rhythm:Regular Rate:Normal     Neuro/Psych negative neurological ROS  negative psych ROS   GI/Hepatic negative GI ROS, Neg liver ROS,   Endo/Other  diabetesHypothyroidism   Renal/GU negative Renal ROS  negative genitourinary   Musculoskeletal negative musculoskeletal ROS (+)   Abdominal Normal abdominal exam  (+) - obese,  Abdomen: soft.    Peds  Hematology negative hematology ROS (+)   Anesthesia Other Findings   Reproductive/Obstetrics negative OB ROS                             Anesthesia Physical Anesthesia Plan  ASA: III  Anesthesia Plan: General   Post-op Pain Management:    Induction:   PONV Risk Score and Plan: 2 and Treatment may vary due to age or medical condition and Propofol infusion  Airway Management Planned: Nasal Cannula and Natural Airway  Additional Equipment:   Intra-op Plan:   Post-operative Plan:   Informed Consent: I have reviewed the patients History and Physical, chart, labs and discussed the procedure including the risks, benefits and alternatives for the proposed anesthesia with the patient or authorized representative who has indicated his/her understanding and acceptance.     Dental advisory given  Plan Discussed with: CRNA  Anesthesia Plan Comments:          Anesthesia Quick Evaluation Patient Active Problem List   Diagnosis Date Noted  . Uncontrolled type 2 diabetes mellitus with hyperglycemia ([place]) 05/31/2020  . Mixed hyperlipidemia 05/31/2020  . Malignant neoplasm of thyroid gland ([place]) 01/22/2019  . Postsurgical hypothyroidism 01/22/2019    No flowsheet data found. No flowsheet data found.  Risks and benefits of anesthesia discussed at length, patient or surrogate demonstrates understanding. Appropriately NPO. Plan to proceed with anesthesia.  Champ Mungo, MD 06/23/20

## 2020-06-23 NOTE — Anesthesia Postprocedure Evaluation (Signed)
Anesthesia Post Note  Patient: Caleb Ortiz  Procedure(s) Performed: COLONOSCOPY WITH BIOPSY (N/A Rectum) POLYPECTOMY (N/A Rectum)     Patient location during evaluation: PACU Anesthesia Type: General Level of consciousness: awake and alert Pain management: pain level controlled Vital Signs Assessment: post-procedure vital signs reviewed and stable Respiratory status: spontaneous breathing, nonlabored ventilation, respiratory function stable and patient connected to nasal cannula oxygen Cardiovascular status: blood pressure returned to baseline and stable Postop Assessment: no apparent nausea or vomiting Anesthetic complications: no   No complications documented.  Sinda Du

## 2020-06-23 NOTE — Transfer of Care (Signed)
Immediate Anesthesia Transfer of Care Note  Patient: Caleb Ortiz  Procedure(s) Performed: COLONOSCOPY WITH BIOPSY (N/A Rectum) POLYPECTOMY (N/A Rectum)  Patient Location: PACU  Anesthesia Type: General  Level of Consciousness: awake, alert  and patient cooperative  Airway and Oxygen Therapy: Patient Spontanous Breathing and Patient connected to supplemental oxygen  Post-op Assessment: Post-op Vital signs reviewed, Patient's Cardiovascular Status Stable, Respiratory Function Stable, Patent Airway and No signs of Nausea or vomiting  Post-op Vital Signs: Reviewed and stable  Complications: No complications documented.

## 2020-06-24 ENCOUNTER — Encounter: Payer: Self-pay | Admitting: Gastroenterology

## 2020-06-27 LAB — SURGICAL PATHOLOGY

## 2020-06-28 ENCOUNTER — Encounter: Payer: Self-pay | Admitting: Gastroenterology

## 2020-06-29 ENCOUNTER — Ambulatory Visit: Payer: Medicare Other

## 2020-08-27 IMAGING — NM NUCLEAR MEDICINE [ID] POST THERAPY WHOLE BODY SCAN
4 series · 4 of 4 positions shown · non-contrast
Comparison: None.

CLINICAL DATA: Thyroid cancer post therapy

EXAM:
NUCLEAR MEDICINE 7-242 POST THERAPY WHOLE BODY SCAN
TECHNIQUE: The patient received 150 mCi 7-242 sodium iodide for the treatment
of thyroid cancer within the past 10 days. The patient returns
today, and whole body scanning was performed in the anterior and
posterior projections.

[Series 1: i131 whole body · 2.66mm/px · 1 of 1 slices shown (1 of 2)]
[im 1/1  full-range]
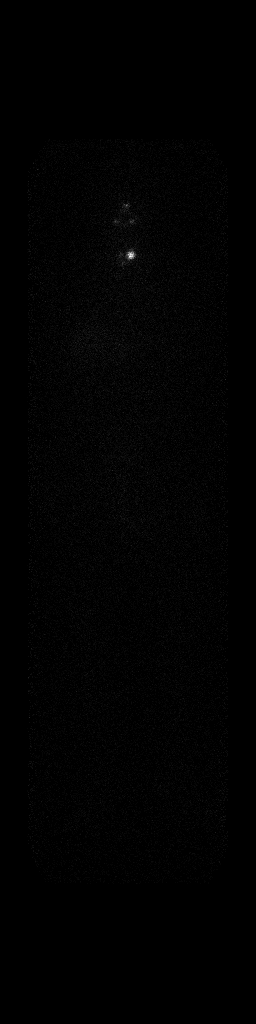

[Series 1: i131 whole body · 2.66mm/px · 1 of 1 slices shown (2 of 2)]
[im 1/1  full-range]
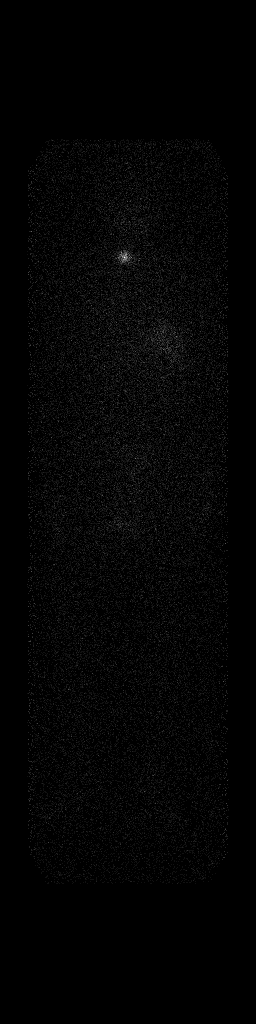

[Series 2: static with marker · 4.14mm/px · 1 of 1 slices shown]
[im 1/1]
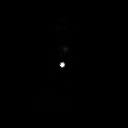

[Series 3: static without marker · non-contrast · 4.14mm/px · 1 of 1 slices shown]
[im 1/1]
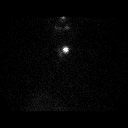

[4 of 4 positions shown; findings below may reference images not displayed]

FINDINGS: Foci of uptake are seen at the thyroid bed bilaterally compatible
with thyroid remnant.

Physiologic excretion of tracer within the liver, colon and bladder.

No distant sites of abnormal iodine accumulation are identified to
suggest iodine-avid metastatic thyroid cancer.
IMPRESSION: Uptake of tracer within the thyroid bed bilaterally consistent with
thyroid remnant.

No scintigraphic evidence of iodine-avid metastatic thyroid cancer.

## 2021-05-24 ENCOUNTER — Telehealth: Payer: Self-pay | Admitting: "Endocrinology

## 2021-05-24 DIAGNOSIS — E1165 Type 2 diabetes mellitus with hyperglycemia: Secondary | ICD-10-CM

## 2021-05-24 DIAGNOSIS — E782 Mixed hyperlipidemia: Secondary | ICD-10-CM

## 2021-05-24 DIAGNOSIS — E890 Postprocedural hypothyroidism: Secondary | ICD-10-CM

## 2021-05-24 NOTE — Telephone Encounter (Signed)
Can you update labs 

## 2021-05-24 NOTE — Telephone Encounter (Signed)
Labs updated and sent to [facility].

## 2021-05-29 LAB — BASIC METABOLIC PANEL
BUN: 8 (ref 4–21)
Creatinine: 0.8 (ref 0.6–1.3)
Glucose: 110
Sodium: 139 (ref 137–147)

## 2021-05-29 LAB — LIPID PANEL
Cholesterol: 186 (ref 0–200)
HDL: 45 (ref 35–70)
LDL Cholesterol: 115

## 2021-05-29 LAB — HEPATIC FUNCTION PANEL: Bilirubin, Total: 0.7

## 2021-05-29 LAB — TSH: TSH: 0.04 — AB (ref 0.41–5.90)

## 2021-05-29 LAB — COMPREHENSIVE METABOLIC PANEL
Albumin: 4.6 (ref 3.5–5.0)
Globulin: 1.9

## 2021-05-31 ENCOUNTER — Other Ambulatory Visit: Payer: Self-pay

## 2021-05-31 ENCOUNTER — Encounter: Payer: Self-pay | Admitting: "Endocrinology

## 2021-05-31 ENCOUNTER — Ambulatory Visit (INDEPENDENT_AMBULATORY_CARE_PROVIDER_SITE_OTHER): Payer: Medicare Other | Admitting: "Endocrinology

## 2021-05-31 VITALS — BP 124/78 | HR 72 | Ht 66.0 in | Wt 170.0 lb

## 2021-05-31 DIAGNOSIS — E119 Type 2 diabetes mellitus without complications: Secondary | ICD-10-CM | POA: Diagnosis not present

## 2021-05-31 DIAGNOSIS — E890 Postprocedural hypothyroidism: Secondary | ICD-10-CM | POA: Diagnosis not present

## 2021-05-31 DIAGNOSIS — C73 Malignant neoplasm of thyroid gland: Secondary | ICD-10-CM | POA: Diagnosis not present

## 2021-05-31 DIAGNOSIS — E782 Mixed hyperlipidemia: Secondary | ICD-10-CM | POA: Diagnosis not present

## 2021-05-31 MED ORDER — METFORMIN HCL ER 500 MG PO TB24: 500.0000 mg | ORAL_TABLET | Freq: Two times a day (BID) | ORAL | 1 refills | Status: AC

## 2021-05-31 MED ORDER — LEVOTHYROXINE SODIUM 112 MCG PO TABS
112.0000 ug | ORAL_TABLET | Freq: Every day | ORAL | 1 refills | Status: DC
Start: 2021-05-31 — End: 2022-06-27

## 2021-05-31 MED ORDER — ROSUVASTATIN CALCIUM 20 MG PO TABS
20.0000 mg | ORAL_TABLET | Freq: Every day | ORAL | 1 refills | Status: DC
Start: 2021-05-31 — End: 2022-12-26

## 2021-05-31 MED ORDER — LOSARTAN POTASSIUM 50 MG PO TABS
50.0000 mg | ORAL_TABLET | Freq: Every day | ORAL | 1 refills | Status: DC
Start: 2021-05-31 — End: 2022-06-27

## 2021-05-31 NOTE — Patient Instructions (Signed)

## 2021-05-31 NOTE — Progress Notes (Signed)
05/31/2021, 5:59 PM  Endocrinology follow-up note   Subjective:    Patient ID: Caleb Ortiz, male    DOB: July 06, 1951, PCP Rosita Fire, MD   Past Medical History:  Diagnosis Date   History of thyroid cancer    Hypertension    Hypothyroidism    Past Surgical History:  Procedure Laterality Date   COLONOSCOPY WITH PROPOFOL N/A 06/23/2020   Procedure: COLONOSCOPY WITH BIOPSY;  Surgeon: Lucilla Lame, MD;  Location: [place] [facility];  Service: Endoscopy;  Laterality: N/A;  priority 4   POLYPECTOMY N/A 06/23/2020   Procedure: POLYPECTOMY;  Surgeon: Lucilla Lame, MD;  Location: [facility];  Service: Endoscopy;  Laterality: N/A;   THYROID SURGERY     Total thyroidectomy   Social History   Socioeconomic History   Marital status: Married    Spouse name: Not on file   Number of children: Not on file   Years of education: Not on file   Highest education level: Not on file  Occupational History   Not on file  Tobacco Use   Smoking status: Never   Smokeless tobacco: Never  Vaping Use   Vaping Use: Never used  Substance and Sexual Activity   Alcohol use: Not Currently   Drug use: Never   Sexual activity: Not on file  Other Topics Concern   Not on file  Social History Narrative   Not on file   Social Determinants of Health   Financial Resource Strain: Not on file  Food Insecurity: Not on file  Transportation Needs: Not on file  Physical Activity: Not on file  Stress: Not on file  Social Connections: Not on file   Family History  Problem Relation Age of Onset   Hypothyroidism Neg Hx        2 SONS   Outpatient Encounter Medications as of 05/31/2021  Medication Sig   levothyroxine (SYNTHROID) 112 MCG tablet Take 1 tablet (112 mcg total) by mouth daily before breakfast.   losartan (COZAAR) 50 MG tablet Take 1 tablet (50 mg total) by mouth daily.   metFORMIN (GLUCOPHAGE XR) 500 MG 24 hr  tablet Take 1 tablet (500 mg total) by mouth 2 (two) times daily.   rosuvastatin (CRESTOR) 20 MG tablet Take 1 tablet (20 mg total) by mouth daily.   [DISCONTINUED] levothyroxine (SYNTHROID) 125 MCG tablet Take 1 tablet (125 mcg total) by mouth daily before breakfast.   [DISCONTINUED] losartan (COZAAR) 50 MG tablet Take 50 mg by mouth daily.   [DISCONTINUED] metFORMIN (GLUCOPHAGE XR) 500 MG 24 hr tablet Take 1 tablet (500 mg total) by mouth daily with breakfast. (Patient taking differently: Take 500 mg by mouth 2 (two) times daily.)   [DISCONTINUED] rosuvastatin (CRESTOR) 20 MG tablet Take 1 tablet (20 mg total) by mouth daily.   No facility-administered encounter medications on file as of 05/31/2021.   ALLERGIES: No Known Allergies  VACCINATION STATUS: There is no immunization history for the selected administration types on file for this patient.   HPI Caleb Ortiz is 70 y.o. male who presents today with a medical history as above. He is here to follow-up for his yearly follow-up after postsurgical hypothyroidism, history of thyroid malignancy, hyperlipidemia, type 2  diabetes. 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 -He received adjuvant thyroid remnant ablation with I-131, 150 mCi, on February 06, 2019-  with negative whole-body scan subsequently for distant metastasis.  His thyroglobulin level was 2 with negative antithyroglobulin antibodies.  -Before his last visit  thyroid/neck ultrasound was  negative for any thyroid residual nor cervical lymphadenopathy.  He also had undetectable thyroglobulin level. -He is on levothyroxine 125  mcg p.o. daily before breakfast.  -  He has no new complaints today, recovered from his surgery very well.    He denies  dysphagia, shortness of breath, nor voice change.  -He denies any family history of  thyroid malignancy.  He does have hypothyroidism in 2 of his sons.  He has well-controlled hypertension on losartan 50 mg p.o. daily. -He is on Crestor 20 mg p.o. daily for hyperlipidemia. -His point-of-care A1c 6.8%, currently on metformin 500 mg p.o. twice daily.   He is a non-smoker, currently in good health, married, actively working.    Review of Systems  Constitutional: Steady weight, no fatigue, no subjective hyperthermia, no subjective hypothermia Eyes: no blurry vision, no xerophthalmia ENT: no sore throat, no nodules palpated in throat, no dysphagia/odynophagia, no hoarseness Cardiovascular: no Chest Pain, no Shortness of Breath, no palpitations, no leg swelling Respiratory: no cough, no shortness of breath Gastrointestinal: no Nausea/Vomiting/Diarhhea Musculoskeletal: no muscle/joint aches Skin: no rashes Neurological: no tremors, no numbness, no tingling, no dizziness Psychiatric: no depression, no anxiety  Objective:    BP 124/78   Pulse 72   Ht _0  (1.676 m)   Wt 170 lb (77.1 kg)   BMI 27.44 kg/m   Wt Readings from Last 3 Encounters:  05/31/21 170 lb (77.1 kg)  06/23/20 167 lb 15.9 oz (76.2 kg)  06/15/20 170 lb (77.1 kg)    Physical Exam  Constitutional: + BMI of 26.6 , not in acute distress, normal state of mind Eyes: PERRLA, EOMI, no exophthalmos ENT: moist mucous membranes, + well-healed post thyroidectomy scar, no gross mass or lymphadenopathy in the neck.  Surgical biopsy report from [place] summarized:  Specimen of total thyroidectomy weighing 135 g.  Right lobe measuring 10 x 3 x 2.2 cm, left lobe measures 5 x 3 x 2.5 cm, isthmus measures 3.5 x 2 x 1 cm and pyramidal lobe measuring 1.5 x 1 x 0.3 cm.  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  Maximum tumor size 6.5 cm.  The tumor lies at a distance of less than 0.1 cm from the  nearest external surface.  There is no evidence of lymphovascular or perineural invasion or extrathyroidal extension. Left lobe, isthmus and pyramidal lobe with no tumor. 1 right level 3 lymph node with reactive changes and no tumor.    [id number]  Recent thyroid function tests where indicated for slight over replacement.  Recently his levothyroxine dose was lowered to 137 mcg p.o. daily before breakfast.    Recent Results (from the past 2160 hour(s))  Lipid Panel     Status: Abnormal   Collection Time: 05/29/21 11:03 AM  Result Value Ref Range   Cholesterol, Total 186 100 - 199 mg/dL   Triglycerides 147 0 - 149 mg/dL   HDL 45 >39 mg/dL   VLDL Cholesterol Cal 26 5 - 40  mg/dL   LDL Chol Calc (NIH) 115 (H) 0 - 99 mg/dL   Chol/HDL Ratio 4.1 0.0 - 5.0 ratio    Comment:                                   T. Chol/HDL Ratio                                             Men  Women                               1/2 Avg.Risk  3.4    3.3                                   Avg.Risk  5.0    4.4                                2X Avg.Risk  9.6    7.1                                3X Avg.Risk 23.4   11.0   Thyroglobulin Level     Status: None (Preliminary result)   Collection Time: 05/29/21 11:03 AM  Result Value Ref Range   Thyroglobulin (TG-RIA) WILL FOLLOW   T4, Free     Status: Abnormal   Collection Time: 05/29/21 11:03 AM  Result Value Ref Range   Free T4 1.87 (H) 0.82 - 1.77 ng/dL  TSH     Status: Abnormal   Collection Time: 05/29/21 11:03 AM  Result Value Ref Range   TSH 0.039 (L) 0.450 - 4.500 uIU/mL  Comprehensive metabolic panel     Status: Abnormal   Collection Time: 05/29/21 11:03 AM  Result Value Ref Range   Glucose 110 (H) 70 - 99 mg/dL   BUN 8 8 - 27 mg/dL   Creatinine, Ser 0.83 0.76 - 1.27 mg/dL   eGFR 95 >59 mL/min/1.73   BUN/Creatinine Ratio 10 10 - 24   Sodium 139 134 - 144 mmol/L   Potassium 3.9 3.5 - 5.2 mmol/L   Chloride 103 96 - 106 mmol/L   CO2 23 20 - 29 mmol/L    Calcium 9.3 8.6 - 10.2 mg/dL   Total Protein 6.5 6.0 - 8.5 g/dL   Albumin 4.6 3.8 - 4.8 g/dL   Globulin, Total 1.9 1.5 - 4.5 g/dL   Albumin/Globulin Ratio 2.4 (H) 1.2 - 2.2   Bilirubin Total 0.7 0.0 - 1.2 mg/dL   Alkaline Phosphatase 60 44 - 121 IU/L   AST 15 0 - 40 IU/L   ALT 19 0 - 44 IU/L    Post I-131 therapy whole-body scan on February 16, 2019 FINDINGS: Foci of uptake are seen at the thyroid bed bilaterally compatible with thyroid remnant.  Physiologic excretion of tracer within the liver, colon and bladder.   No distant sites of abnormal iodine accumulation are identified to suggest iodine-avid metastatic thyroid cancer.   IMPRESSION: Uptake of tracer within the thyroid bed bilaterally consistent with thyroid remnant.  No  scintigraphic evidence of iodine-avid metastatic thyroid cancer.   Thyroid/neck ultrasound on May 17, 2020 FINDINGS: No thyroid tissue identified within the thyroid bed, status post nuclear medicine ablation.  No adenopathy.   IMPRESSION: No residual tissue within the thyroid bed status post nuclear medicine ablation.  Assessment & Plan:   1. 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  Surgery on August 13, 2018.  He is status post thyroid remnant ablation with 150 mCi of I-131 with negative post therapy whole-body scan for distant metastasis. -Stimulated thyroglobulin level was 2 on February 06, 2019 with negative thyroglobulin antibodies.  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 -His previsit thyroid/neck ultrasound is negative for tumor recurrence nor cervical lymphadenopathy. -He is made aware of the fact that he will need periodic imaging at least once a year for the next 5 years to assure tumor early detection should he have a recurrence. -He will be  considered for another ultrasound anytime now.  He will be called for the results as he will not return for follow-up until a year from now.   2. Postsurgical hypothyroidism -His most recent thyroid function test was significant for slight over replacement.  He is advised to lower his levothyroxine to 112 mcg p.o. daily before breakfast.     - We discussed about the correct intake of his thyroid hormone, on empty stomach at fasting, with water, separated by at least 30 minutes from breakfast and other medications,  and separated by more than 4 hours from calcium, iron, multivitamins, acid reflux medications (PPIs). -Patient is made aware of the fact that thyroid hormone replacement is needed for life, dose to be adjusted by periodic monitoring of thyroid function tests.  -[place], advised to obtain TSH/free T4 in 6 months.   3.  Hyperlipidemia -His lipid panel showed improved LDL at 115, improving from 145.  He is advised to continue Crestor 20 mg p.o. nightly.  [employer] plant predominant lifestyle nutrition is discussed with him.    4.  Type 2 diabetes -His point-of-care A1c is 6.8%, improving from 7%.  He is benefiting from early initiation of metformin.  He is advised to continue metformin 500 mg p.o. twice daily.    - I advised him  to maintain close follow up with Rosita Fire, MD for primary care needs.    I spent 34 minutes in the care of the patient today including review of labs from Thyroid Function, CMP, and other relevant labs ; imaging/biopsy records (current and previous including abstractions from other facilities); face-to-face time discussing  his lab results and symptoms, medications doses, his options of short and long term treatment based on the latest standards of care / guidelines;   and documenting the encounter.  Arvon Phelps Dodge  participated in the discussions, expressed understanding, and voiced agreement with the above plans.   All questions were answered to his satisfaction. he is encouraged to contact clinic should he have any questions or concerns prior to his return visit.   Follow up plan: Return in about 1 year (around 05/31/2022), or U/S anytime, for F/U with Pre-visit Labs, A1c -NV.   Glade Lloyd, MD [facility] [address] [place], [place] [address] Phone: [phone]  Fax: [phone]     05/31/2021, 5:59 PM  This note was partially dictated  with voice recognition software. Similar sounding words can be transcribed inadequately or may not  be corrected upon review.

## 2021-06-01 ENCOUNTER — Other Ambulatory Visit: Payer: Medicare Other

## 2021-06-01 DIAGNOSIS — Z125 Encounter for screening for malignant neoplasm of prostate: Secondary | ICD-10-CM

## 2021-06-02 LAB — PSA TOTAL (REFLEX TO FREE): Prostate Specific Ag, Serum: 7.6 ng/mL — ABNORMAL HIGH (ref 0.0–4.0)

## 2021-06-02 LAB — FPSA% REFLEX
% FREE PSA: 21.1 %
PSA, FREE: 1.6 ng/mL

## 2021-06-04 LAB — COMPREHENSIVE METABOLIC PANEL
ALT: 19 IU/L (ref 0–44)
AST: 15 IU/L (ref 0–40)
Albumin/Globulin Ratio: 2.4 — ABNORMAL HIGH (ref 1.2–2.2)
Albumin: 4.6 g/dL (ref 3.8–4.8)
Alkaline Phosphatase: 60 IU/L (ref 44–121)
BUN/Creatinine Ratio: 10 (ref 10–24)
BUN: 8 mg/dL (ref 8–27)
Bilirubin Total: 0.7 mg/dL (ref 0.0–1.2)
CO2: 23 mmol/L (ref 20–29)
Calcium: 9.3 mg/dL (ref 8.6–10.2)
Chloride: 103 mmol/L (ref 96–106)
Creatinine, Ser: 0.83 mg/dL (ref 0.76–1.27)
Globulin, Total: 1.9 g/dL (ref 1.5–4.5)
Glucose: 110 mg/dL — ABNORMAL HIGH (ref 70–99)
Potassium: 3.9 mmol/L (ref 3.5–5.2)
Sodium: 139 mmol/L (ref 134–144)
Total Protein: 6.5 g/dL (ref 6.0–8.5)
eGFR: 95 mL/min/{1.73_m2} (ref >59)

## 2021-06-04 LAB — THYROGLOBULIN LEVEL: Thyroglobulin (TG-RIA): <2 ng/mL

## 2021-06-04 LAB — T4, FREE: Free T4: 1.87 ng/dL — ABNORMAL HIGH (ref 0.82–1.77)

## 2021-06-04 LAB — LIPID PANEL
Chol/HDL Ratio: 4.1 ratio (ref 0.0–5.0)
Cholesterol, Total: 186 mg/dL (ref 100–199)
HDL: 45 mg/dL (ref >39)
LDL Chol Calc (NIH): 115 mg/dL — ABNORMAL HIGH (ref 0–99)
Triglycerides: 147 mg/dL (ref 0–149)
VLDL Cholesterol Cal: 26 mg/dL (ref 5–40)

## 2021-06-04 LAB — TSH: TSH: 0.039 u[IU]/mL — ABNORMAL LOW (ref 0.450–4.500)

## 2021-06-14 ENCOUNTER — Encounter: Payer: Self-pay | Admitting: Urology

## 2021-06-14 ENCOUNTER — Ambulatory Visit (INDEPENDENT_AMBULATORY_CARE_PROVIDER_SITE_OTHER): Payer: Medicare Other | Admitting: Urology

## 2021-06-14 ENCOUNTER — Other Ambulatory Visit: Payer: Self-pay

## 2021-06-14 VITALS — BP 132/73 | HR 77 | Ht 67.0 in | Wt 168.0 lb

## 2021-06-14 DIAGNOSIS — N401 Enlarged prostate with lower urinary tract symptoms: Secondary | ICD-10-CM | POA: Diagnosis not present

## 2021-06-14 DIAGNOSIS — Z125 Encounter for screening for malignant neoplasm of prostate: Secondary | ICD-10-CM | POA: Diagnosis not present

## 2021-06-14 DIAGNOSIS — N138 Other obstructive and reflux uropathy: Secondary | ICD-10-CM | POA: Diagnosis not present

## 2021-06-14 DIAGNOSIS — R972 Elevated prostate specific antigen [PSA]: Secondary | ICD-10-CM

## 2021-06-14 NOTE — Patient Instructions (Signed)
Prostate-Specific Antigen Test Why am I having this test? The prostate-specific antigen (PSA) test is a screening test for prostate cancer. It can identify early signs of prostate cancer, which may allow for early detection and more effective treatment. Your health care provider may recommend that you have a PSA test starting at age 69 or that you have one earlier if you are at higher risk for prostate cancer. You may also have a PSA test: To monitor treatment of prostate cancer. To check whether prostate cancer has returned after treatment. What is being tested? This test measures the amount of PSA in your blood. PSA is a protein that is made in the prostate. The prostate naturally produces more PSA as you age, but very high levels may be a sign of a medical condition. What kind of sample is taken? A blood sample is required for this test. It is usually collected by inserting a needle into a blood vessel but can also be collected by sticking a finger with a small needle. Blood for this test should be drawn before having an exam of the prostate that involves digital rectal examination to avoid affecting the results. How do I prepare for this test? Do not ejaculate starting 24 hours before your test, or as long as told by your health care provider, as this can cause an elevation in PSA. Do not undergo any procedures that require manipulation of the prostate, such as biopsy or surgery, for 6 weeks before the test is done as this can cause an elevation in PSA. Tell a health care provider about: Any signs you may have of other conditions that can affect PSA levels, such as: An enlarged prostate that is not caused by cancer (benign prostatic hyperplasia, or BPH). This condition is very common in older men. A prostate or urinary tract infection. Any allergies you have. All medicines you are taking, including vitamins, herbs, eye drops, creams, and over-the-counter medicines. This also includes: Medicines  to assist with hair growth, such as finasteride. Any recent exposure to a medicine called diethylstilbestrol (DES). Medicines such as male hormones (like testosterone) or other medicines that raise testosterone levels. Any bleeding problems you have. Any recent procedures you have had, especially any procedures involving the prostate or rectum. Any medical conditions you have. How are the results reported? Your test results will be reported as a value that indicates how much PSA is in your blood. This will be given as nanograms of PSA per milliliter of blood (ng/mL). Your health care provider will compare your results to normal ranges that were established after testing a large group of people (reference ranges). Reference ranges may vary among labs and hospitals. PSA levels vary from person to person and generally increase with age. Because of this variation, there is no single PSA value that is considered normal for everyone. Instead, PSA reference ranges are used to describe whether your PSA levels are considered low or high (elevated). Common reference ranges are: Low: 0-2.5 ng/mL. Slightly to moderately elevated: 2.6-10.0 ng/mL. Moderately elevated: 10.0-19.9 ng/mL. Significantly elevated: 20 ng/mL or greater. What do the results mean? A test result that is higher than 4 ng/mL may mean that you have prostate cancer. However, a PSA test by itself is not enough to diagnose prostate cancer. High PSA levels may also be caused by the natural aging process, prostate infection (prostatitis), or BPH. PSA screening cannot tell you if your PSA is high due to cancer or a different cause. A prostate biopsy  is the only way to diagnose prostate cancer. A risk of having the PSA test is diagnosing and treating prostate cancer that would never have caused any symptoms or problems (overdiagnosis and overtreatment). Talk with your health care provider about what your results mean. In some cases, your health care  provider may do more testing to confirm the results. Questions to ask your health care provider Ask your health care provider, or the department that is doing the test: When will my results be ready? How will I get my results? What are my treatment options? What other tests do I need? What are my next steps? Summary The prostate-specific antigen (PSA) test is a screening test for prostate cancer. Your health care provider may recommend that you have a PSA test starting at age 48 or that you have one earlier if you are at higher risk for prostate cancer. A test result that is higher than 4 ng/mL may mean that you have prostate cancer. However, elevated levels can be caused by a number of conditions other than prostate cancer. Talk with your health care provider about what your results mean. This information is not intended to replace advice given to you by your health care provider. Make sure you discuss any questions you have with your health care provider. Document Revised: 11/16/2020 Document Reviewed: 11/16/2020 Elsevier Patient Education  2022 [place].  Prostate Cancer Screening Prostate cancer screening is testing that is done to check for the presence of prostate cancer in men. The prostate gland is a walnut-sized gland that is located below the bladder and in front of the rectum in males. The function of the prostate is to add fluid to semen during ejaculation. Prostate cancer is one of the most common types of cancer in men. Who should have prostate cancer screening? Screening recommendations vary based on age and other risk factors, as well as between the professional organizations who make the recommendations. In general, screening is recommended if: You are age 34 to 13 and have an average risk for prostate cancer. You should talk with your health care provider about your need for screening and how often screening should be done. Because most prostate cancers are slow growing  and will not cause death, screening in this age group is generally reserved for men who have a 64- to 15-year life expectancy. You are younger than age 82, and you have these risk factors: Having a father, brother, or uncle who has been diagnosed with prostate cancer. The risk is higher if your family member's cancer occurred at an early age or if you have multiple family members with prostate cancer at an early age. Being a male who is [place] or is of [place] or sub-Saharan African descent. In general, screening is not recommended if: You are younger than age 4. You are between the ages of 67 and 58 and you have no risk factors. You are 109 years of age or older. At this age, the risks that screening can cause are greater than the benefits that it may provide. If you are at high risk for prostate cancer, your health care provider may recommend that you have screenings more often or that you start screening at a younger age. How is screening for prostate cancer done? The recommended prostate cancer screening test is a blood test called the prostate-specific antigen (PSA) test. PSA is a protein that is made in the prostate. As you age, your prostate naturally produces more PSA. Abnormally high PSA levels  may be caused by: Prostate cancer. An enlarged prostate that is not caused by cancer (benign prostatic hyperplasia, or BPH). This condition is very common in older men. A prostate gland infection (prostatitis) or urinary tract infection. Certain medicines such as male hormones (like testosterone) or other medicines that raise testosterone levels. A rectal exam may be done as part of prostate cancer screening to help provide information about the size of your prostate gland. When a rectal exam is performed, it should be done after the PSA level is drawn to avoid any effect on the results. Depending on the PSA results, you may need more tests, such as: A physical exam to check the size of your  prostate gland, if not done as part of screening. Blood and imaging tests. A procedure to remove tissue samples from your prostate gland for testing (biopsy). This is the only way to know for certain if you have prostate cancer. What are the benefits of prostate cancer screening? Screening can help to identify cancer at an early stage, before symptoms start and when the cancer can be treated more easily. There is a small chance that screening may lower your risk of dying from prostate cancer. The chance is small because prostate cancer is a slow-growing cancer, and most men with prostate cancer die from a different cause. What are the risks of prostate cancer screening? The main risk of prostate cancer screening is diagnosing and treating prostate cancer that would never have caused any symptoms or problems. This is called overdiagnosisand overtreatment. PSA screening cannot tell you if your PSA is high due to cancer or a different cause. A prostate biopsy is the only procedure to diagnose prostate cancer. Even the results of a biopsy may not tell you if your cancer needs to be treated. Slow-growing prostate cancer may not need any treatment other than monitoring, so diagnosing and treating it may cause unnecessary stress or other side effects. Questions to ask your health care provider When should I start prostate cancer screening? What is my risk for prostate cancer? How often do I need screening? What type of screening tests do I need? How do I get my test results? What do my results mean? Do I need treatment? Where to find more information The American Cancer Society: www.cancer.org American Urological Association: www.auanet.org Contact a health care provider if: You have difficulty urinating. You have pain when you urinate or ejaculate. You have blood in your urine or semen. You have pain in your back or in the area of your prostate. Summary Prostate cancer is a common type of cancer  in men. The prostate gland is located below the bladder and in front of the rectum. This gland adds fluid to semen during ejaculation. Prostate cancer screening may identify cancer at an early stage, when the cancer can be treated more easily and is less likely to have spread to other areas of the body. The prostate-specific antigen (PSA) test is the recommended screening test for prostate cancer, but it has associated risks. Discuss the risks and benefits of prostate cancer screening with your health care provider. If you are age 68 or older, the risks that screening can cause are greater than the benefits that it may provide. This information is not intended to replace advice given to you by your health care provider. Make sure you discuss any questions you have with your health care provider. Document Revised: 01/02/2021 Document Reviewed: 01/02/2021 Elsevier Patient Education  [place].

## 2021-06-14 NOTE — Progress Notes (Signed)
   06/14/2021 1:03 PM   Cavin Westley Hummer Apr 24, 1951 [id number]  Reason for visit: Follow up elevated PSA, BPH  HPI: 70 year old healthy male from [place] who originally presented with an elevated PSA of 7.97, and repeat remained elevated at 7.8(24% free).  He had a vasovagal episode with DRE previously.  He underwent a prostate biopsy on 06/09/2020 showing a 67 g prostate with PSA density of 0.12.  All cores showed only benign prostate tissue, and there was no evidence of malignancy.  He reportedly had multiple PSAs drawn in [place] over the last year that were stable at around 7-8.  Most recent PSA on 06/01/2021 is stable at 7.6 with 21% free.  BPH is the most likely etiology of his PSA elevation with the stability of the PSA over time, reassuring PSA density, and negative biopsy.  Reassurance was provided, I recommended repeat PSA with reflex to free in 1 year, consider prostate MRI in the future if PSA were to rise significantly.  He has mild urinary symptoms of some weak stream or feeling of incomplete emptying over the last year.  He is minimally bothered by this.  We discussed options including a trial of Flomax, but he would like to hold off at this time.  Return precautions discussed.  RTC 1 year with PSA reflex to free prior, PVR   Billey Co, MD  [facility] [address] [place], [place] [address] [phone]

## 2021-06-22 ENCOUNTER — Ambulatory Visit (HOSPITAL_COMMUNITY)
Admission: RE | Admit: 2021-06-22 | Discharge: 2021-06-22 | Disposition: A | Discharge status: Home / Self Care | Payer: Medicare Other | Source: Ambulatory Visit | Attending: Internal Medicine | Admitting: Internal Medicine

## 2021-06-22 ENCOUNTER — Other Ambulatory Visit: Payer: Self-pay

## 2021-06-22 DIAGNOSIS — R000 Tachycardia, unspecified: Secondary | ICD-10-CM | POA: Diagnosis present

## 2021-06-22 DIAGNOSIS — I10 Essential (primary) hypertension: Secondary | ICD-10-CM | POA: Diagnosis not present

## 2021-06-22 NOTE — Progress Notes (Signed)
EKG performed. RT called Dr Legrand Rams and spoke with him directly about abnormal ECG results.

## 2021-06-26 ENCOUNTER — Ambulatory Visit (HOSPITAL_COMMUNITY)
Admission: RE | Admit: 2021-06-26 | Discharge: 2021-06-26 | Disposition: A | Discharge status: Home / Self Care | Payer: Medicare Other | Source: Ambulatory Visit | Attending: "Endocrinology | Admitting: "Endocrinology

## 2021-06-26 ENCOUNTER — Other Ambulatory Visit: Payer: Self-pay

## 2021-06-26 DIAGNOSIS — C73 Malignant neoplasm of thyroid gland: Secondary | ICD-10-CM | POA: Diagnosis present

## 2021-11-26 IMAGING — US US THYROID
1 series · 14 of 22 positions shown · non-contrast
Comparison: No prior duplex

CLINICAL DATA: 69-year-old male with a history of thyroid neoplasm

EXAM:
THYROID ULTRASOUND
TECHNIQUE: Ultrasound examination of the thyroid gland and adjacent soft
tissues was performed.

[Series 1: us thyroid · 14 of 22 slices shown]
[im 1/22]
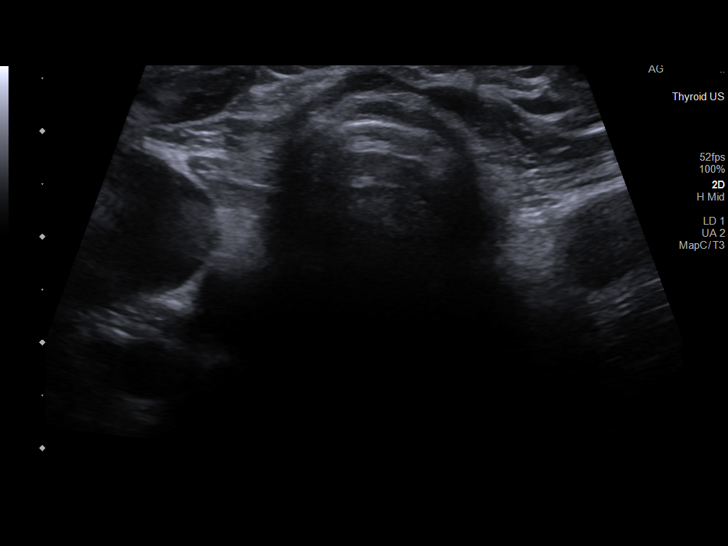
[im 3/22]
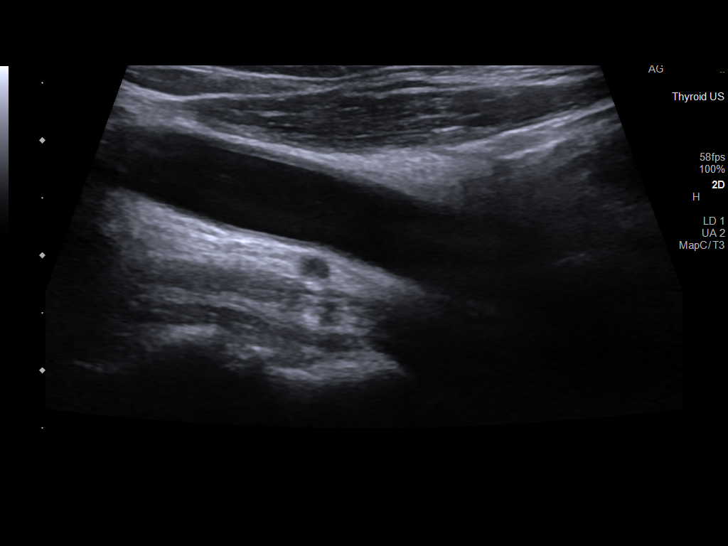
[im 4/22]
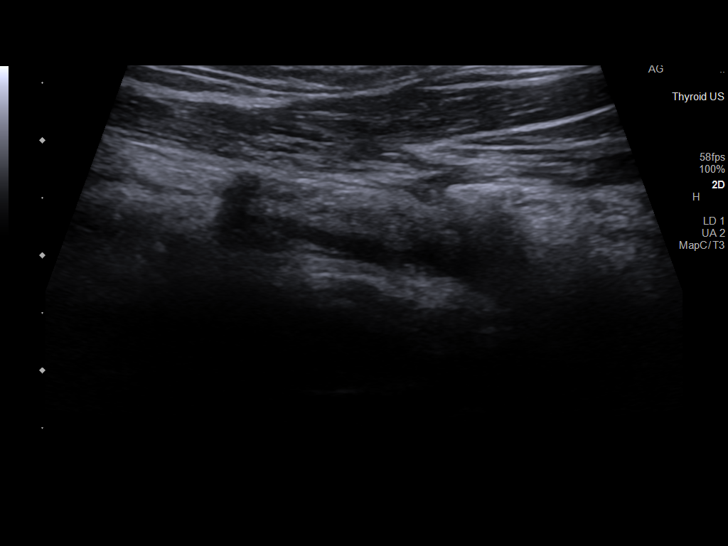
[im 6/22]
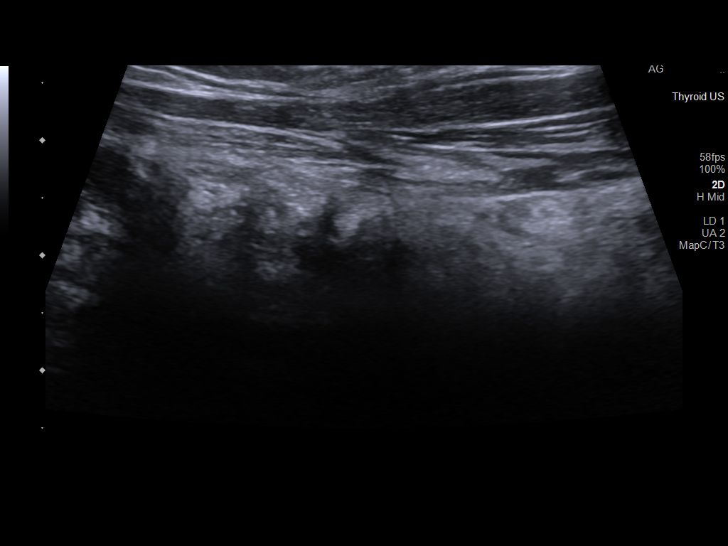
[im 8/22]
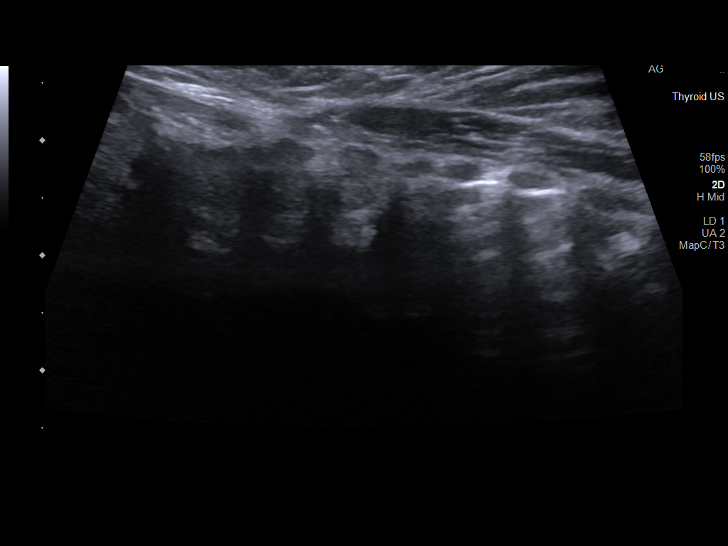
[im 9/22]
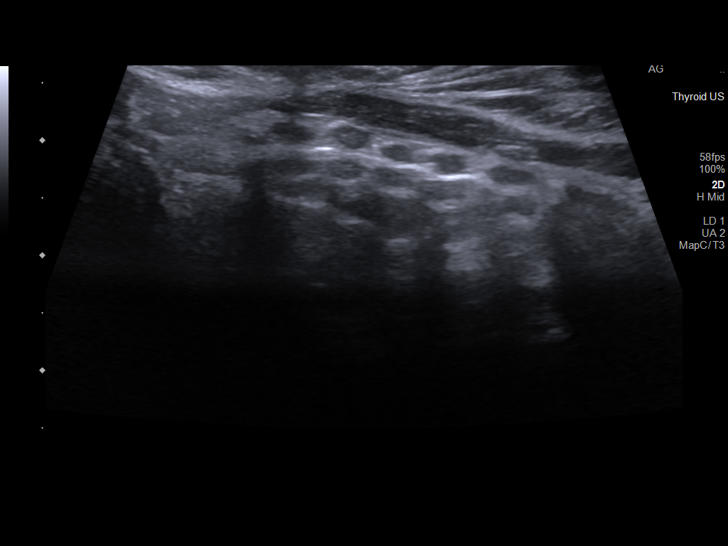
[im 11/22]
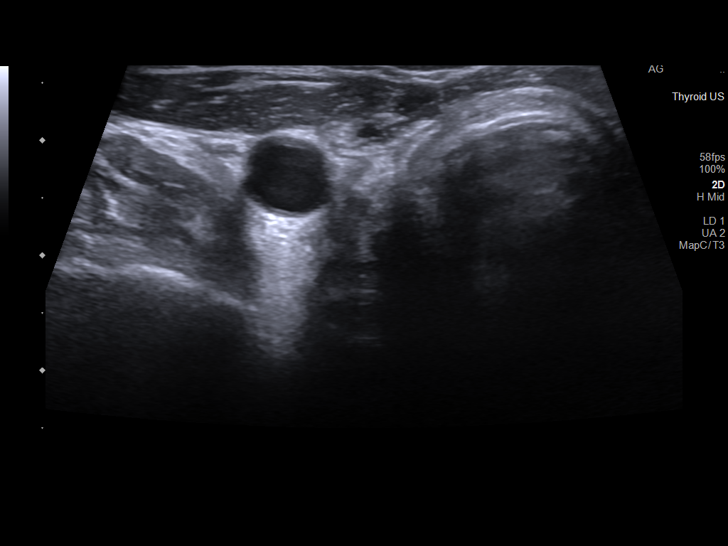
[im 12/22]
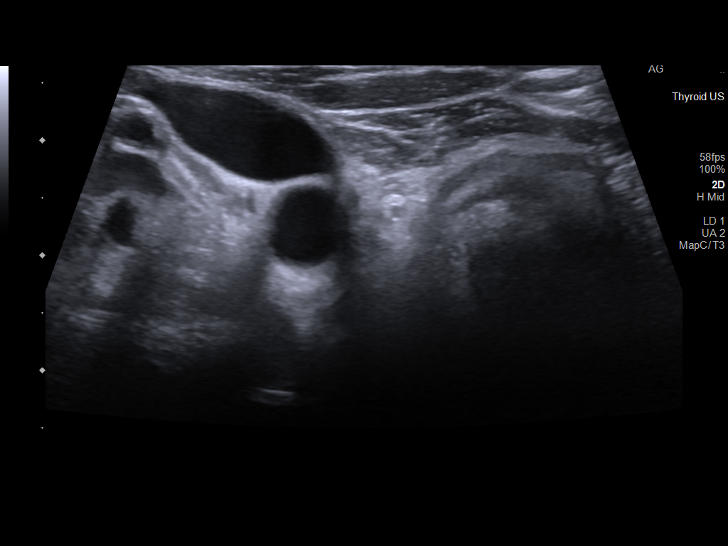
[im 14/22]
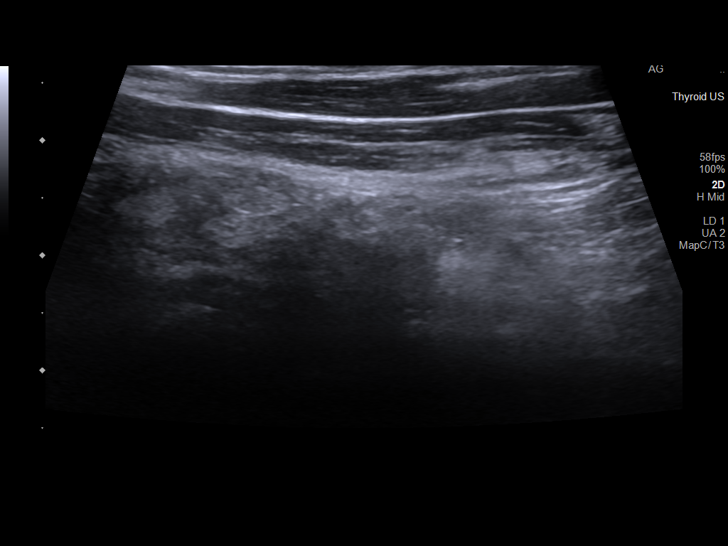
[im 15/22]
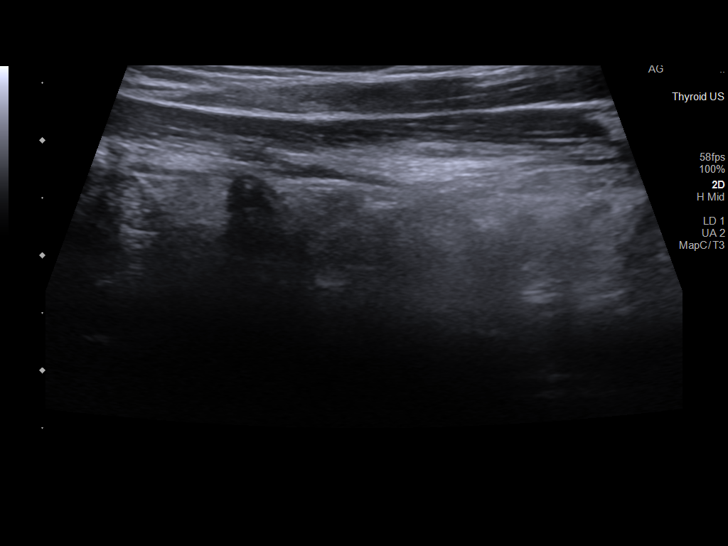
[im 17/22]
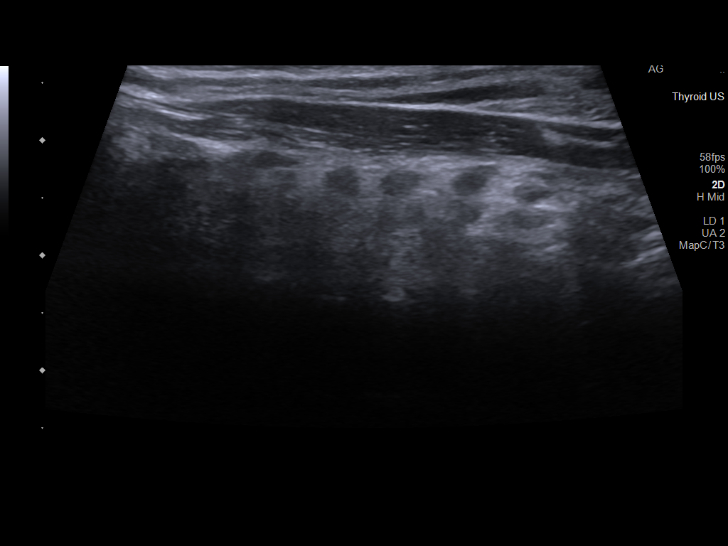
[im 19/22]
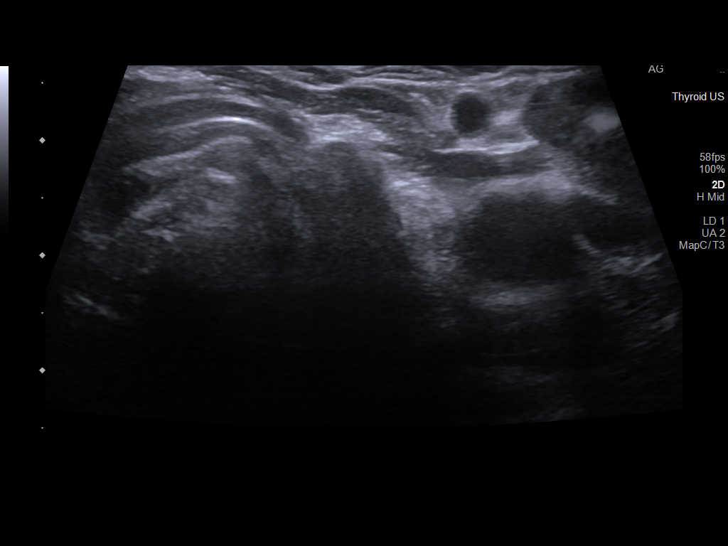
[im 20/22]
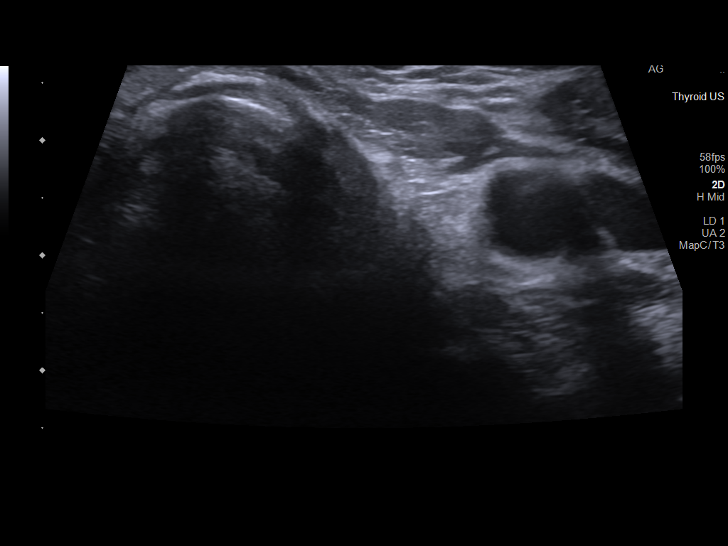
[im 22/22]
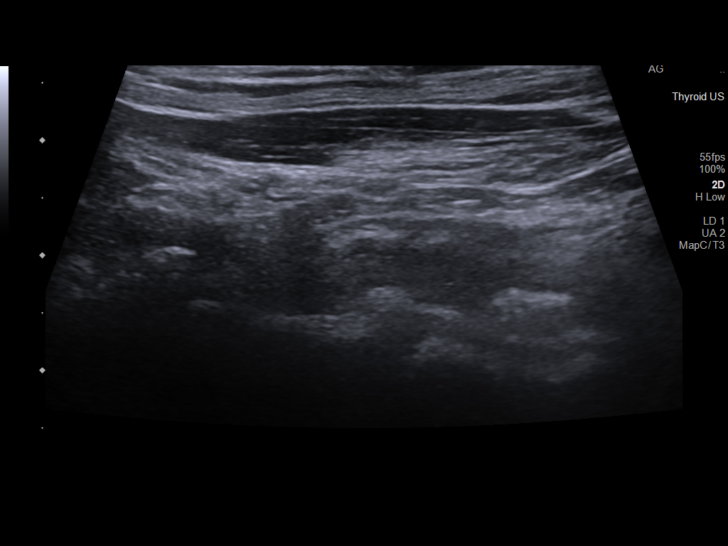

[14 of 22 positions shown; findings below may reference images not displayed]

FINDINGS: No thyroid tissue identified within the thyroid bed, status post
nuclear medicine ablation.

No adenopathy
IMPRESSION: No residual tissue within the thyroid bed status post nuclear
medicine ablation.

## 2022-05-31 ENCOUNTER — Ambulatory Visit: Payer: Medicare Other | Admitting: "Endocrinology

## 2022-06-21 ENCOUNTER — Other Ambulatory Visit: Payer: Self-pay | Admitting: *Deleted

## 2022-06-21 DIAGNOSIS — R972 Elevated prostate specific antigen [PSA]: Secondary | ICD-10-CM

## 2022-06-22 ENCOUNTER — Other Ambulatory Visit: Payer: Medicare Other

## 2022-06-22 DIAGNOSIS — R972 Elevated prostate specific antigen [PSA]: Secondary | ICD-10-CM

## 2022-06-23 LAB — PSA: Prostate Specific Ag, Serum: 7.7 ng/mL — ABNORMAL HIGH (ref 0.0–4.0)

## 2022-06-27 ENCOUNTER — Ambulatory Visit: Payer: Medicare Other | Admitting: Urology

## 2022-06-27 ENCOUNTER — Encounter: Payer: Self-pay | Admitting: Urology

## 2022-06-27 VITALS — BP 119/75 | HR 69 | Ht 67.0 in | Wt 171.0 lb

## 2022-06-27 DIAGNOSIS — Z125 Encounter for screening for malignant neoplasm of prostate: Secondary | ICD-10-CM | POA: Diagnosis not present

## 2022-06-27 DIAGNOSIS — N138 Other obstructive and reflux uropathy: Secondary | ICD-10-CM | POA: Diagnosis not present

## 2022-06-27 DIAGNOSIS — N401 Enlarged prostate with lower urinary tract symptoms: Secondary | ICD-10-CM | POA: Diagnosis not present

## 2022-06-27 LAB — BLADDER SCAN AMB NON-IMAGING

## 2022-06-27 MED ORDER — TAMSULOSIN HCL 0.4 MG PO CAPS: 0.4000 mg | ORAL_CAPSULE | Freq: Every day | ORAL | 11 refills | Status: DC

## 2022-06-27 NOTE — Progress Notes (Signed)
   06/27/2022 12:33 PM   Tommie Westley Hummer 1951-05-09 [id number]  Reason for visit: Follow up elevated PSA, BPH, new urinary symptoms  HPI: 71 year old healthy male from [place] who originally presented with an elevated PSA of 7.97, and repeat remained elevated at 7.8(24% free).  He had a vasovagal episode with DRE previously.  He underwent a prostate biopsy on 06/09/2020 showing a 67 g prostate with PSA density of 0.12.  All cores showed only benign prostate tissue, and there was no evidence of malignancy.  His PSA remains stable at 7.7 this month from 7.8 two years ago.  BPH is the most likely etiology of his PSA elevation with the stability of the PSA over time, reassuring PSA density, and negative biopsy.    He does report some worsening urinary symptoms over the last 10 months with some weak stream, urgency, occasional urge incontinence, and frequency.  PVR today elevated at 341m.  [place] so options are limited at this time.  I recommended starting Flomax, with close follow-up when [place] early [place] for repeat PVR and symptom check.  We also reviewed HOLEP as an option for more definitive treatment of his incomplete emptying likely secondary to BPH, and we will rediscuss outlet procedures [place]  -Reassurance provided regarding stable PSA in the setting of negative prostate biopsy -Trial of Flomax for urinary symptoms and incomplete emptying, return precautions discussed extensively, [place].  RTC early 2024 for PVR and symptom check, rediscuss [place]  [place] [place]ans[place]BP[place] Hall [phone]

## 2022-06-27 NOTE — Patient Instructions (Signed)
Holmium Laser Enucleation of the Prostate (HoLEP)  HoLEP is a treatment for men with benign prostatic hyperplasia (BPH). The laser surgery removed blockages of urine flow, and is done without any incisions on the body.     What is HoLEP?  HoLEP is a type of laser surgery used to treat obstruction (blockage) of urine flow as a result of benign prostatic hyperplasia (BPH). In men with BPH, the prostate gland is not cancerous, but has become enlarged. An enlarged prostate can result in a number of urinary tract symptoms such as weak urinary stream, difficulty in starting urination, inability to urinate, frequent urination, or getting up at night to urinate.  HoLEP was developed in the 1990's as a more effective and less expensive surgical option for BPH, compared to other surgical options such as laser vaporization(PVP/greenlight laser), transurethral resection of the prostate(TURP), and open simple prostatectomy.   What happens during a HoLEP?  HoLEP requires general anesthesia ("asleep" throughout the procedure).   An antibiotic is given to reduce the risk of infection  A surgical instrument called a resectoscope is inserted through the urethra (the tube that carries urine from the bladder). The resectoscope has a camera that allows the surgeon to view the internal structure of the prostate gland, and to see where the incisions are being made during surgery.  The laser is inserted into the resectoscope and is used to enucleate (free up) the enlarged prostate tissue from the capsule (outer shell) and then to seal up any blood vessels. The tissue that has been removed is pushed back into the bladder.  A morcellator is placed through the resectoscope, and is used to suction out the prostate tissue that has been pushed into the bladder.  When the prostate tissue has been removed, the resectoscope is removed, and a foley catheter is placed to allow healing and drain the urine from the  bladder.     What happens after a HoLEP?  More than 90% of patients go home the same day a few hours after surgery. Less than 10% will be admitted to the hospital overnight for observation to monitor the urine, or if they have other medical problems.  Fluid is flushed through the catheter for about 1 hour after surgery to clear any blood from the urine. It is normal to have some blood in the urine after surgery. The need for blood transfusion is extremely rare.  Eating and drinking are permitted after the procedure once the patient has fully awakened from anesthesia.  The catheter is usually removed 2-3 days after surgery- the patient will come to clinic to have the catheter removed and make sure they can urinate on their own.  It is very important to drink lots of fluids after surgery for one week to keep the bladder flushed.  At first, there may be some burning with urination, but this typically improved within a few hours to days. Most patients do not have a significant amount of pain, and narcotic pain medications are rarely needed.  Symptoms of urinary frequency, urgency, and even leakage are NORMAL for the first few weeks after surgery as the bladder adjusts after having to work hard against blockage from the prostate for many years. This will improve, but can sometimes take several months.  The use of pelvic floor exercises (Kegel exercises) can help improve problems with urinary incontinence.   After catheter removal, patients will be seen at 6 weeks and 6 months for symptom check  No heavy lifting for   at least 2-3 weeks after surgery, however patients can walk and do light activities the first day after surgery. Return to work time depends on occupation.    What are the advantages of HoLEP?  HoLEP has been studied in many different parts of the world and has been shown to be a safe and effective procedure. Although there are many types of BPH surgeries available, HoLEP offers a  unique advantage in being able to remove a large amount of tissue without any incisions on the body, even in very large prostates, while decreasing the risk of bleeding and providing tissue for pathology (to look for cancer). This decreases the need for blood transfusions during surgery, minimizes hospital stay, and reduces the risk of needing repeat treatment.  What are the side effects of HoLEP?  Temporary burning and bleeding during urination. Some blood may be seen in the urine for weeks after surgery and is part of the healing process.  Urinary incontinence (inability to control urine flow) is expected in all patients immediately after surgery and they should wear pads for the first few days/weeks. This typically improves over the course of several weeks. Performing Kegel exercises can help decrease leakage from stress maneuvers such as coughing, sneezing, or lifting. The rate of long term leakage is very low. Patients may also have leakage with urgency and this may be treated with medication. The risk of urge incontinence can be dependent on several factors including age, prostate size, symptoms, and other medical problems.  Retrograde ejaculation or "backwards ejaculation." In 75% of cases, the patient will not see any fluid during ejaculation after surgery.  Erectile function is generally not significantly affected.   What are the risks of HoLEP?  Injury to the urethra or development of scar tissue at a later date  Injury to the capsule of the prostate (typically treated with longer catheterization).  Injury to the bladder or ureteral orifices (where the urine from the kidney drains out)  Infection of the bladder, testes, or kidneys  Return of urinary obstruction at a later date requiring another operation (<2%)  Need for blood transfusion or re-operation due to bleeding  Failure to relieve all symptoms and/or need for prolonged catheterization after surgery  5-15% of patients are  found to have previously undiagnosed prostate cancer in their specimen. Prostate cancer can be treated after HoLEP.  Standard risks of anesthesia including blood clots, heart attacks, etc  When should I call my doctor?  Fever over 101.3 degrees  Inability to urinate, or large blood clots in the urine   Benign Prostatic Hyperplasia  Benign prostatic hyperplasia (BPH) is an enlarged prostate gland that is caused by the normal aging process. The prostate may get bigger as a man gets older. The condition is not caused by cancer. The prostate is a walnut-sized gland that is involved in the production of semen. It is located in front of the rectum and below the bladder. The bladder stores urine. The urethra carries stored urine out of the body. An enlarged prostate can press on the urethra. This can make it harder to pass urine. The buildup of urine in the bladder can cause infection. Back pressure and infection may progress to bladder damage and kidney (renal) failure. What are the causes? This condition is part of the normal aging process. However, not all men develop problems from this condition. If the prostate enlarges away from the urethra, urine flow will not be blocked. If it enlarges toward the urethra and compresses  it, there will be problems passing urine. What increases the risk? This condition is more likely to develop in men older than 50 years. What are the signs or symptoms? Symptoms of this condition include: Getting up often during the night to urinate. Needing to urinate frequently during the day. Difficulty starting urine flow. Decrease in size and strength of your urine stream. Leaking (dribbling) after urinating. Inability to pass urine. This needs immediate treatment. Inability to completely empty your bladder. Pain when you pass urine. This is more common if there is also an infection. Urinary tract infection (UTI). How is this diagnosed? This condition is diagnosed  based on your medical history, a physical exam, and your symptoms. Tests will also be done, such as: A post-void bladder scan. This measures any amount of urine that may remain in your bladder after you finish urinating. A digital rectal exam. In a rectal exam, your health care provider checks your prostate by putting a lubricated, gloved finger into your rectum to feel the back of your prostate gland. This exam detects the size of your gland and any abnormal lumps or growths. An exam of your urine (urinalysis). A prostate specific antigen (PSA) screening. This is a blood test used to screen for prostate cancer. An ultrasound. This test uses sound waves to electronically produce a picture of your prostate gland. Your health care provider may refer you to a specialist in kidney and prostate diseases (urologist). How is this treated? Once symptoms begin, your health care provider will monitor your condition (active surveillance or watchful waiting). Treatment for this condition will depend on the severity of your condition. Treatment may include: Observation and yearly exams. This may be the only treatment needed if your condition and symptoms are mild. Medicines to relieve your symptoms, including: Medicines to shrink the prostate. Medicines to relax the muscle of the prostate. Surgery in severe cases. Surgery may include: Prostatectomy. In this procedure, the prostate tissue is removed completely through an open incision or with a laparoscope or robotics. Transurethral resection of the prostate (TURP). In this procedure, a tool is inserted through the opening at the tip of the penis (urethra). It is used to cut away tissue of the inner core of the prostate. The pieces are removed through the same opening of the penis. This removes the blockage. Transurethral incision (TUIP). In this procedure, small cuts are made in the prostate. This lessens the prostate's pressure on the urethra. Transurethral  microwave thermotherapy (TUMT). This procedure uses microwaves to create heat. The heat destroys and removes a small amount of prostate tissue. Transurethral needle ablation (TUNA). This procedure uses radio frequencies to destroy and remove a small amount of prostate tissue. Interstitial laser coagulation ([place]). This procedure uses a laser to destroy and remove a small amount of prostate tissue. Transurethral electrovaporization (TUVP). This procedure uses electrodes to destroy and remove a small amount of prostate tissue. Prostatic urethral lift. This procedure inserts an implant to push the lobes of the prostate away from the urethra. Follow these instructions at home: Take over-the-counter and prescription medicines only as told by your health care provider. Monitor your symptoms for any changes. Contact your health care provider with any changes. Avoid drinking large amounts of liquid before going to bed or out in public. Avoid or reduce how much caffeine or alcohol you drink. Give yourself time when you urinate. Keep all follow-up visits. This is important. Contact a health care provider if: You have unexplained back pain. Your symptoms do  not get better with treatment. You develop side effects from the medicine you are taking. Your urine becomes very dark or has a bad smell. Your lower abdomen becomes distended and you have trouble passing urine. Get help right away if: You have a fever or chills. You suddenly cannot urinate. You feel light-headed or very dizzy, or you faint. There are large amounts of blood or clots in your urine. Your urinary problems become hard to manage. You develop moderate to severe low back or flank pain. The flank is the side of your body between the ribs and the hip. These symptoms may be an emergency. Get help right away. Call 911. Do not wait to see if the symptoms will go away. Do not drive yourself to the hospital. Summary Benign prostatic  hyperplasia (BPH) is an enlarged prostate that is caused by the normal aging process. It is not caused by cancer. An enlarged prostate can press on the urethra. This can make it hard to pass urine. This condition is more likely to develop in men older than 50 years. Get help right away if you suddenly cannot urinate. This information is not intended to replace advice given to you by your health care provider. Make sure you discuss any questions you have with your health care provider. Document Revised: 01/25/2021 Document Reviewed: 01/25/2021 Elsevier Patient Education  [place].

## 2022-06-28 DIAGNOSIS — E119 Type 2 diabetes mellitus without complications: Secondary | ICD-10-CM | POA: Diagnosis not present

## 2022-06-28 DIAGNOSIS — H2513 Age-related nuclear cataract, bilateral: Secondary | ICD-10-CM | POA: Diagnosis not present

## 2022-06-28 DIAGNOSIS — H04123 Dry eye syndrome of bilateral lacrimal glands: Secondary | ICD-10-CM | POA: Diagnosis not present

## 2022-07-09 ENCOUNTER — Other Ambulatory Visit: Payer: Self-pay | Admitting: "Endocrinology

## 2022-07-09 DIAGNOSIS — E890 Postprocedural hypothyroidism: Secondary | ICD-10-CM

## 2022-07-09 DIAGNOSIS — C73 Malignant neoplasm of thyroid gland: Secondary | ICD-10-CM

## 2022-07-09 DIAGNOSIS — E782 Mixed hyperlipidemia: Secondary | ICD-10-CM

## 2022-07-10 ENCOUNTER — Ambulatory Visit: Payer: Medicare Other | Admitting: "Endocrinology

## 2022-07-10 DIAGNOSIS — E785 Hyperlipidemia, unspecified: Secondary | ICD-10-CM | POA: Diagnosis not present

## 2022-07-10 DIAGNOSIS — I10 Essential (primary) hypertension: Secondary | ICD-10-CM | POA: Diagnosis not present

## 2022-07-10 DIAGNOSIS — Z0001 Encounter for general adult medical examination with abnormal findings: Secondary | ICD-10-CM | POA: Diagnosis not present

## 2022-07-10 DIAGNOSIS — E1165 Type 2 diabetes mellitus with hyperglycemia: Secondary | ICD-10-CM | POA: Diagnosis not present

## 2022-07-10 DIAGNOSIS — Z1389 Encounter for screening for other disorder: Secondary | ICD-10-CM | POA: Diagnosis not present

## 2022-07-10 DIAGNOSIS — Z23 Encounter for immunization: Secondary | ICD-10-CM | POA: Diagnosis not present

## 2022-07-10 DIAGNOSIS — Z85850 Personal history of malignant neoplasm of thyroid: Secondary | ICD-10-CM | POA: Diagnosis not present

## 2022-07-30 ENCOUNTER — Ambulatory Visit: Payer: Medicare Other | Admitting: "Endocrinology

## 2022-11-15 ENCOUNTER — Ambulatory Visit: Payer: Medicare Other | Admitting: Urology

## 2022-12-26 ENCOUNTER — Ambulatory Visit: Payer: Medicare Other | Admitting: Urology

## 2022-12-26 ENCOUNTER — Other Ambulatory Visit: Payer: Self-pay | Admitting: Urology

## 2022-12-26 ENCOUNTER — Encounter: Payer: Self-pay | Admitting: Urology

## 2022-12-26 VITALS — BP 130/77 | HR 59 | Ht 67.0 in | Wt 169.0 lb

## 2022-12-26 DIAGNOSIS — N401 Enlarged prostate with lower urinary tract symptoms: Secondary | ICD-10-CM | POA: Diagnosis not present

## 2022-12-26 DIAGNOSIS — N138 Other obstructive and reflux uropathy: Secondary | ICD-10-CM

## 2022-12-26 DIAGNOSIS — R339 Retention of urine, unspecified: Secondary | ICD-10-CM

## 2022-12-26 NOTE — Progress Notes (Unsigned)
Surgical Physician Order Form [facility] [place]  * Scheduling expectation : Next Available  *Length of Case: 2 hours  *Clearance needed: no  *Anticoagulation Instructions: Hold all anticoagulants  *Aspirin Instructions: Hold Aspirin  *Post-op visit Date/Instructions:  1-3 day cath removal  *Diagnosis: BPH w/urinary obstruction  *Procedure:     HOLEP (16109)   Additional orders: N/A  -Admit type: OUTpatient  -Anesthesia: General  -VTE Prophylaxis Standing Order SCD's       Other:   -Standing Lab Orders Per Anesthesia    Lab other: UA&Urine Culture  -Standing Test orders EKG/Chest x-ray per Anesthesia       Test other:   - Medications:  Ancef 2gm IV  -Other orders:  N/A

## 2022-12-26 NOTE — Patient Instructions (Signed)

## 2022-12-26 NOTE — Progress Notes (Signed)
   12/26/2022 11:33 AM   Caleb Ortiz 09/03/1950 [id number]  Reason for visit: Follow up elevated PSA, BPH and incomplete emptying  HPI: 72 year old male who I followed for the above issues.  He originally presented with an elevated PSA of 7.97 that remained elevated at 7.8(24% free) and underwent a prostate biopsy in November 2021 showing a 67 g gland, small median lobe, PSA density of 0.12, and all cores showed only benign tissue.  PSA has been stable since that time, most recently 7.7 in December 2023.  He also has a history of a vasovagal episode with DRE.  He has had worsening urinary symptoms over the last 1 to 2 years with weak stream, urgency, occasional urge incontinence, frequency, nocturia 5+ times per night.  When I saw him in December 2023 his PVR was elevated at , but he was leaving later that week for a long trip to [place] and our options were limited.  He opted for trial of Flomax.  He has noticed some mild improvement in his symptoms on the Flomax, but PVR remains significantly elevated at today.  We discussed BPH as the most likely etiology of his urinary symptoms and incomplete emptying.  We discussed considering cystoscopy for further evaluation of his anatomy or urethral stricture, but he would like to defer in the setting of his prior vasovagal episode with DRE, and I think this is reasonable.  We discussed possibility of finding additional pathology at time of procedure.  With his large gland and significant incomplete emptying he would be best suited with HOLEP.  We discussed the risks and benefits of HoLEP at length.  The procedure requires general anesthesia and takes 1 to 2 hours, and a holmium laser is used to enucleate the prostate and push this tissue into the bladder.  A morcellator is then used to remove this tissue, which is sent for pathology.  The vast majority(>95%) of patients are able to discharge the same day with a catheter in place  for 2 to 3 days, and will follow-up in clinic for a voiding trial.  We specifically discussed the risks of bleeding, infection, retrograde ejaculation, temporary urgency and urge incontinence, very low risk of long-term incontinence, urethral stricture/bladder neck contracture, pathologic evaluation of prostate tissue and possible detection of prostate cancer or other malignancy, and possible need for additional procedures.  Schedule HOLEP  Sondra Come, MD  [facility] [address] [place], [place] [address] [phone]

## 2022-12-27 ENCOUNTER — Telehealth: Payer: Self-pay

## 2022-12-27 NOTE — Progress Notes (Signed)
   [facility] Surgical Posting Form  Surgery Date: Date: 02/01/2023  Surgeon: Dr. Legrand Rams, MD  Inpt ( No  )   Outpt (Yes)   Obs ( No  )   Diagnosis: N40.1, N13.8 Benign Prostatic Hyperplasia with Urinary Obstruction   -CPT: 16109  Surgery: Holmium Laser Enucleation of the Prostate  Stop Anticoagulations: Yes and also hold ASA  Cardiac/Medical/Pulmonary Clearance needed: no  *Orders entered into EPIC  Date: 12/27/22   *Case booked in EPIC  Date: 12/26/22  *Notified pt of Surgery: Date: 12/26/2022  PRE-OP UA & CX: yes, will obtain in clinic on 01/18/2023  *Placed into Prior Authorization Work Angela Nevin Date: 12/27/22  Assistant/laser/rep:No

## 2022-12-27 NOTE — Telephone Encounter (Signed)
I spoke with Mr. Caleb Ortiz and his son while in clinic. We have discussed possible surgery dates and Friday July 12th, 2024 was agreed upon by all parties. Patient given information about surgery date, what to expect pre-operatively and post operatively.  We discussed that a Pre-Admission Testing office will be calling to set up the pre-op visit that will take place prior to surgery, and that these appointments are typically done over the phone with a Pre-Admissions RN. Informed patient that our office will communicate any additional care to be provided after surgery. Patients questions or concerns were discussed during our call. Advised to call our office should there be any additional information, questions or concerns that arise. Patient verbalized understanding.

## 2023-01-05 IMAGING — US US THYROID
1 series · 14 of 23 positions shown · non-contrast
Comparison: 05/17/2020; postoperative nuclear medicine radioactive
iodine ablation - 02/16/2016

CLINICAL DATA: Other.  History of thyroidectomy for thyroid cancer.

EXAM:
THYROID ULTRASOUND
TECHNIQUE: Ultrasound examination of the thyroid gland and adjacent soft
tissues was performed.

[Series 1: us thyroid · 14 of 23 slices shown]
[im 1/23]
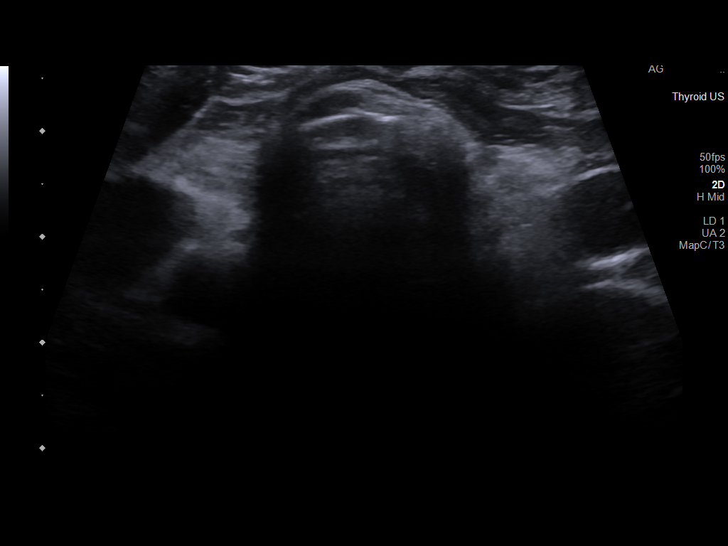
[im 3/23]
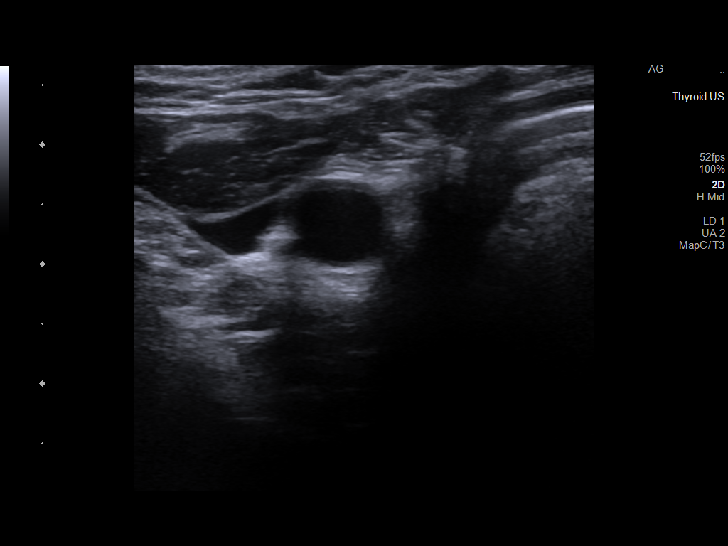
[im 5/23]
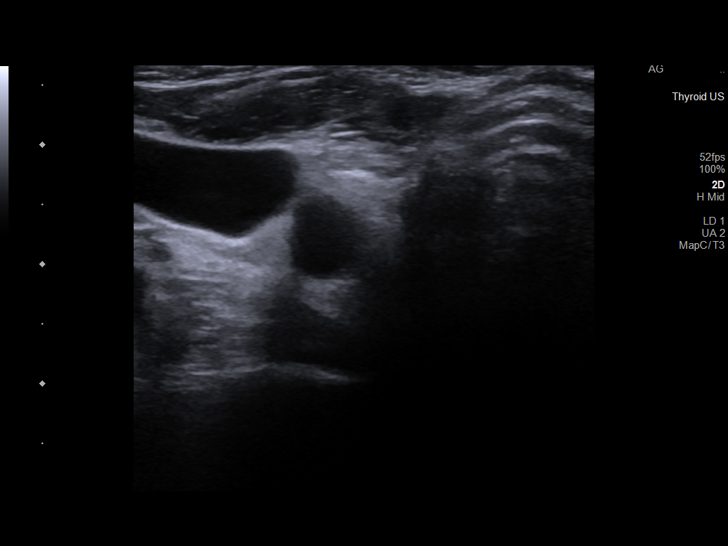
[im 6/23]
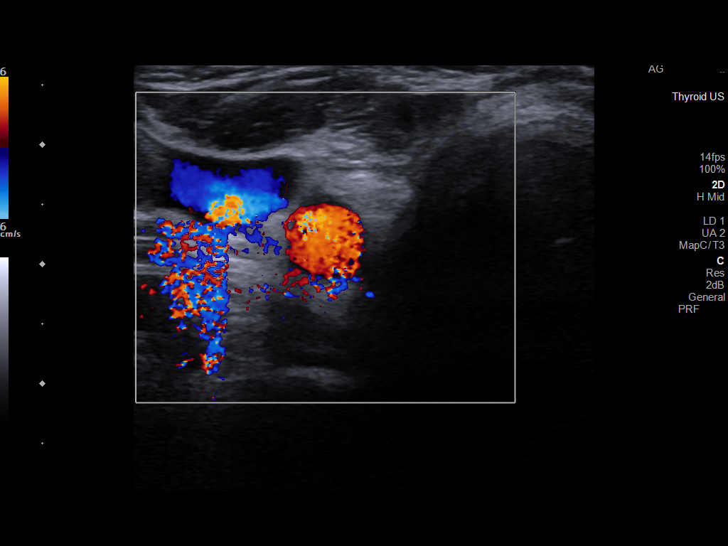
[im 8/23]
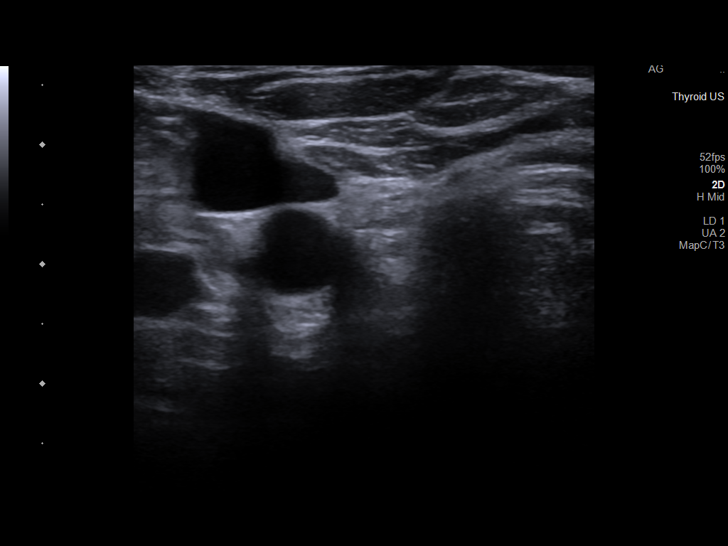
[im 10/23]
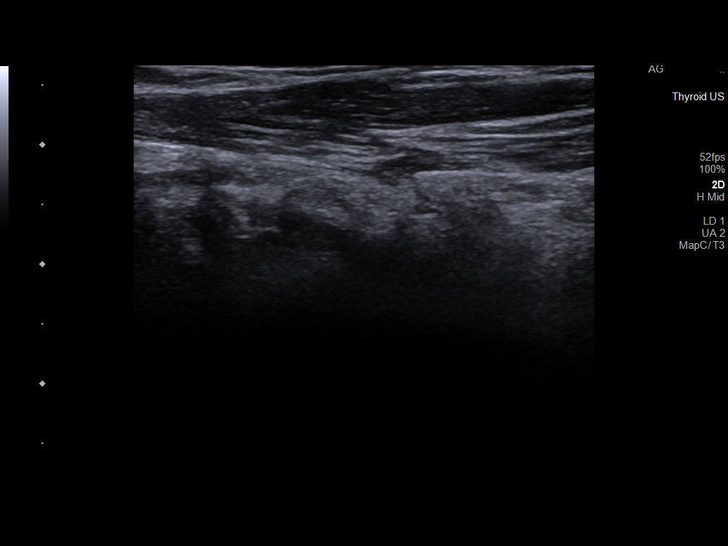
[im 11/23]
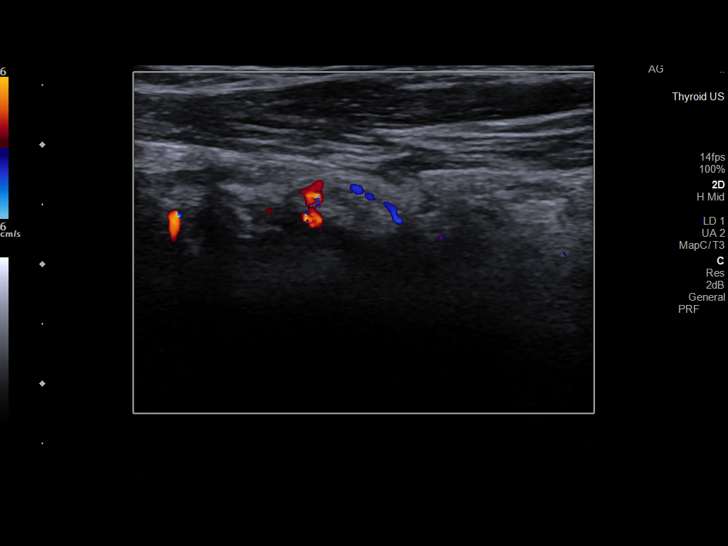
[im 13/23]
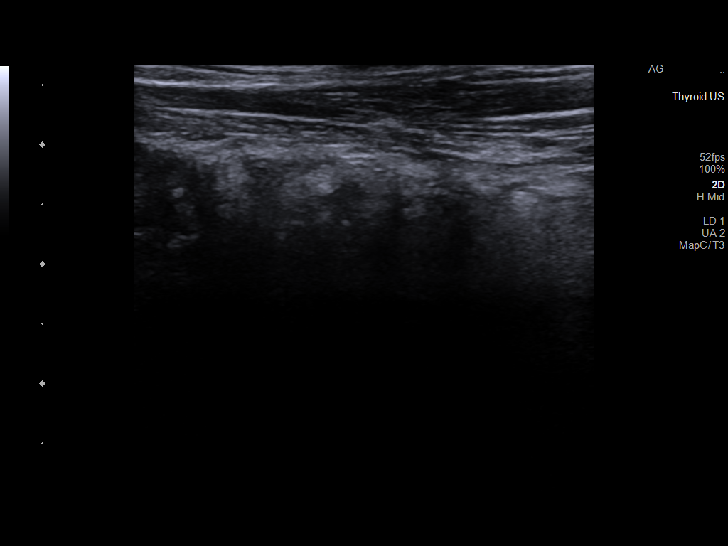
[im 14/23]
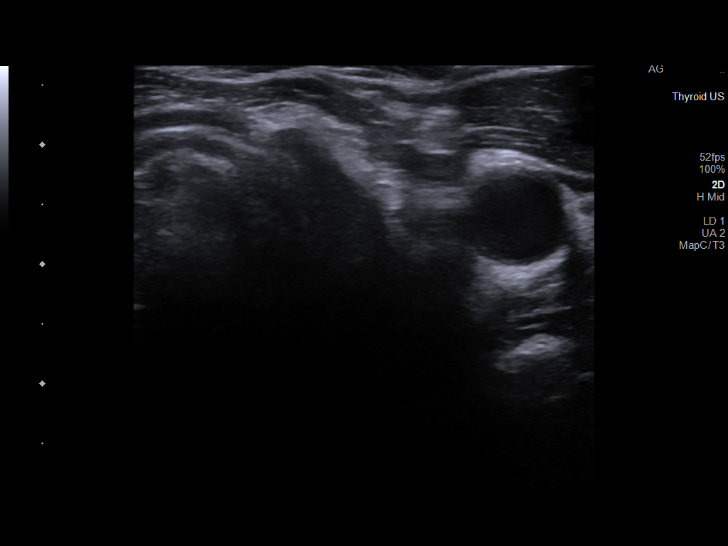
[im 16/23]
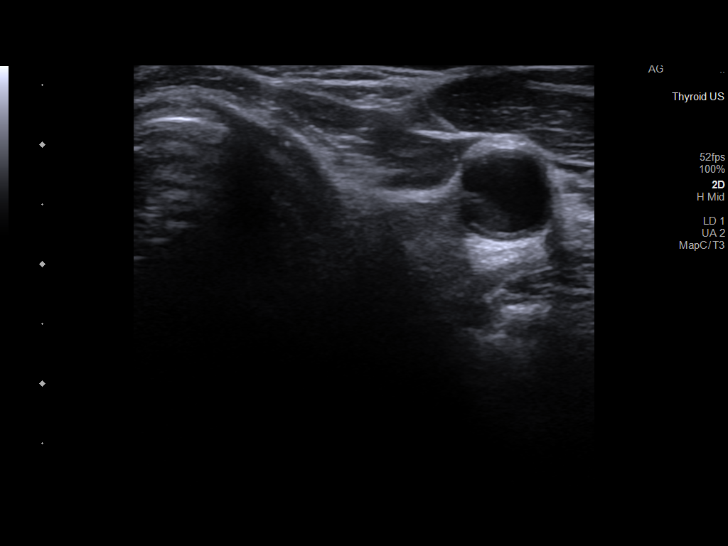
[im 18/23]
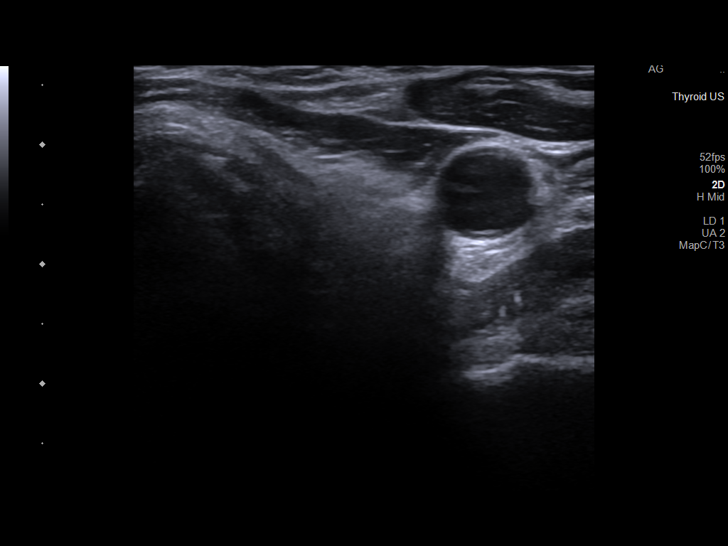
[im 19/23]
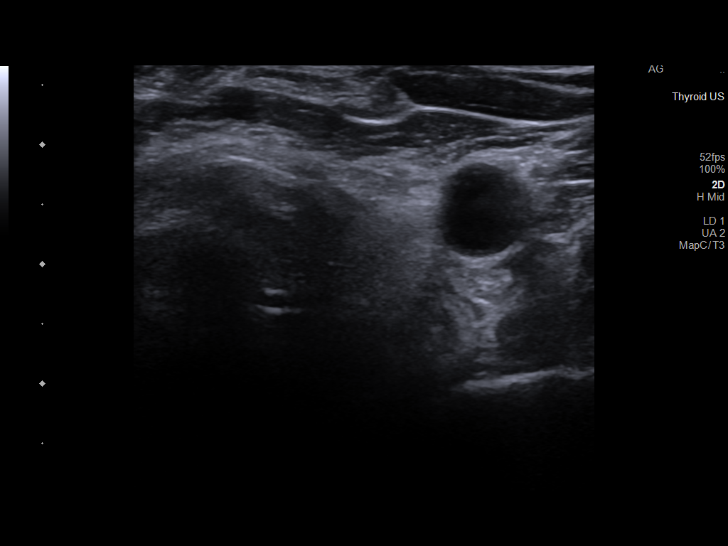
[im 21/23]
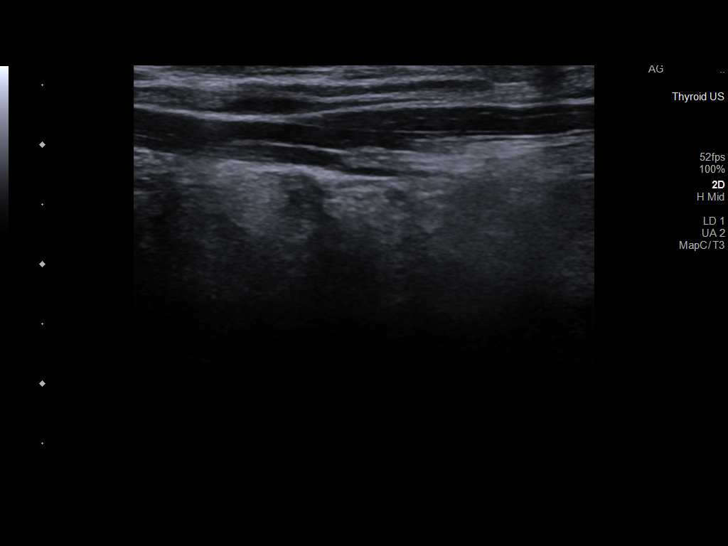
[im 23/23]
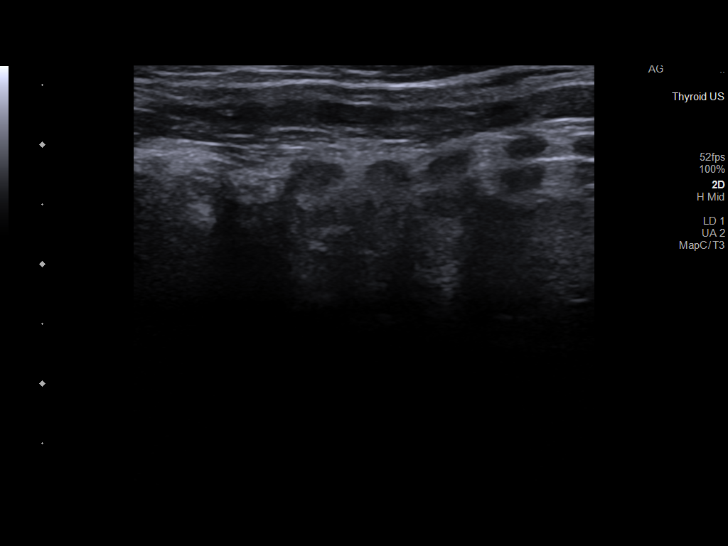

[14 of 23 positions shown; findings below may reference images not displayed]

FINDINGS: Isthmus: Surgically absent. There is no residual nodular soft tissue
within the isthmic resection bed.

Right lobe: Surgically absent. There is no residual nodular soft
tissue within the right lobectomy resection bed.

Left lobe: Surgically absent. There is no residual nodular soft
tissue within the left lobectomy resection bed.

_________________________________________________________

No regional cervical lymphadenopathy.
IMPRESSION: Post total thyroidectomy without evidence of residual or locally
recurrent disease.

## 2023-01-18 ENCOUNTER — Other Ambulatory Visit: Payer: Medicare Other

## 2023-01-18 DIAGNOSIS — N138 Other obstructive and reflux uropathy: Secondary | ICD-10-CM | POA: Diagnosis not present

## 2023-01-18 LAB — URINALYSIS, COMPLETE
Bilirubin, UA: NEGATIVE
Glucose, UA: NEGATIVE
Ketones, UA: NEGATIVE
Leukocytes,UA: NEGATIVE
Nitrite, UA: NEGATIVE
Protein,UA: NEGATIVE
RBC, UA: NEGATIVE
Specific Gravity, UA: 1.015 (ref 1.005–1.030)
Urobilinogen, Ur: 1 mg/dL (ref 0.2–1.0)
pH, UA: 7 (ref 5.0–7.5)

## 2023-01-18 LAB — MICROSCOPIC EXAMINATION

## 2023-01-22 LAB — CULTURE, URINE COMPREHENSIVE

## 2023-01-23 ENCOUNTER — Encounter
Admission: RE | Admit: 2023-01-23 | Discharge: 2023-01-23 | Disposition: A | Discharge status: Home / Self Care | Payer: Medicare Other | Source: Ambulatory Visit | Attending: Urology | Admitting: Urology

## 2023-01-23 ENCOUNTER — Encounter: Payer: Self-pay | Admitting: Urgent Care

## 2023-01-23 ENCOUNTER — Other Ambulatory Visit: Payer: Self-pay

## 2023-01-23 VITALS — Ht 68.0 in | Wt 170.0 lb

## 2023-01-23 DIAGNOSIS — E1165 Type 2 diabetes mellitus with hyperglycemia: Secondary | ICD-10-CM | POA: Diagnosis not present

## 2023-01-23 DIAGNOSIS — Z01810 Encounter for preprocedural cardiovascular examination: Secondary | ICD-10-CM

## 2023-01-23 DIAGNOSIS — E119 Type 2 diabetes mellitus without complications: Secondary | ICD-10-CM | POA: Insufficient documentation

## 2023-01-23 DIAGNOSIS — Z01818 Encounter for other preprocedural examination: Secondary | ICD-10-CM | POA: Diagnosis not present

## 2023-01-23 DIAGNOSIS — I10 Essential (primary) hypertension: Secondary | ICD-10-CM | POA: Insufficient documentation

## 2023-01-23 HISTORY — DX: Other obstructive and reflux uropathy: N13.8

## 2023-01-23 HISTORY — DX: Type 2 diabetes mellitus without complications: E11.9

## 2023-01-23 LAB — BASIC METABOLIC PANEL
Anion gap: 9 (ref 5–15)
BUN: 14 mg/dL (ref 8–23)
CO2: 25 mmol/L (ref 22–32)
Calcium: 8.8 mg/dL — ABNORMAL LOW (ref 8.9–10.3)
Chloride: 104 mmol/L (ref 98–111)
Creatinine, Ser: 0.72 mg/dL (ref 0.61–1.24)
GFR, Estimated: >60 mL/min (ref >60)
Glucose, Bld: 143 mg/dL — ABNORMAL HIGH (ref 70–99)
Potassium: 3.5 mmol/L (ref 3.5–5.1)
Sodium: 138 mmol/L (ref 135–145)

## 2023-01-23 NOTE — Patient Instructions (Addendum)
Your procedure is scheduled on: Friday , July 12  Report to the Registration Desk on the 1st floor of the [facility] To find out your arrival time, please call [phone] between 1PM - 3PM on: Thursday , July 11 If your arrival time is 6:00 am, do not arrive before that time as the [facility] entrance doors do not open until 6:00 am.  REMEMBER: Instructions that are not followed completely may result in serious medical risk, up to and including death; or upon the discretion of your surgeon and anesthesiologist your surgery may need to be rescheduled.  Do not eat food or liquid after midnight the night before surgery.  No gum chewing or hard candies.   One week prior to surgery: Stop Anti-inflammatories (NSAIDS) such as Advil, Aleve, Ibuprofen, Motrin, Naproxen, Naprosyn and Aspirin based products such as Excedrin, Goody's Powder, BC Powder. Stop ANY OVER THE COUNTER supplements until after surgery. You may however, continue to take Tylenol if needed for pain up until the day of surgery.  Continue taking all prescribed medications with the exception of the following:  metFORMIN (GLUCOPHAGE ) stop 2 days prior ( Last dose Tuesday July 9)    TAKE ONLY THESE MEDICATIONS THE MORNING OF SURGERY WITH A SIP OF WATER: levothyroxine (SYNTHROID)   No Alcohol for 24 hours before or after surgery.  No Smoking including e-cigarettes for 24 hours before surgery.  No chewable tobacco products for at least 6 hours before surgery.  No nicotine patches on the day of surgery.  Do not use any "recreational" drugs for at least a week (preferably 2 weeks) before your surgery.  Please be advised that the combination of cocaine and anesthesia may have negative outcomes, up to and including death. If you test positive for cocaine, your surgery will be cancelled.  On the morning of surgery brush your teeth with toothpaste and water, you may rinse your mouth with mouthwash if you wish. Do not  swallow any toothpaste or mouthwash.  Do not wear jewelry, make-up, hairpins, clips or nail polish.  Do not wear lotions, powders, or perfumes.   Do not shave body hair from the neck down 48 hours before surgery.  Contact lenses, hearing aids and dentures may not be worn into surgery.  Do not bring valuables to the hospital. [facility] is not responsible for any missing/lost belongings or valuables.   Notify your doctor if there is any change in your medical condition (cold, fever, infection).  Wear comfortable clothing (specific to your surgery type) to the hospital.  After surgery, you can help prevent lung complications by doing breathing exercises.  Take deep breaths and cough every 1-2 hours.   If you are being admitted to the hospital overnight, leave your suitcase in the car. After surgery it may be brought to your room.  In case of increased patient census, it may be necessary for you, the patient, to continue your postoperative care in the Same Day Surgery department.  If you are being discharged the day of surgery, you will not be allowed to drive home. You will need a responsible individual to drive you home and stay with you for 24 hours after surgery.   If you are taking public transportation, you will need to have a responsible individual with you.  Please call the Pre-admissions Testing Dept. at [phone] if you have any questions about these instructions.  Surgery Visitation Policy:  Patients having surgery or a procedure may have two visitors.  Children under the age of 26 must have an adult with them who is not the patient.

## 2023-02-01 ENCOUNTER — Encounter: Payer: Self-pay | Admitting: Urology

## 2023-02-01 ENCOUNTER — Ambulatory Visit: Payer: Medicare Other | Admitting: Urgent Care

## 2023-02-01 ENCOUNTER — Ambulatory Visit
Admission: RE | Admit: 2023-02-01 | Discharge: 2023-02-01 | Disposition: A | Discharge status: Home / Self Care | Payer: Medicare Other | Source: Ambulatory Visit | Attending: Urology | Admitting: Urology

## 2023-02-01 ENCOUNTER — Other Ambulatory Visit: Payer: Self-pay

## 2023-02-01 ENCOUNTER — Encounter
Admission: RE | Disposition: A | Discharge status: Home / Self Care | Payer: Self-pay | Source: Ambulatory Visit | Attending: Urology

## 2023-02-01 DIAGNOSIS — E890 Postprocedural hypothyroidism: Secondary | ICD-10-CM | POA: Insufficient documentation

## 2023-02-01 DIAGNOSIS — N401 Enlarged prostate with lower urinary tract symptoms: Secondary | ICD-10-CM | POA: Diagnosis not present

## 2023-02-01 DIAGNOSIS — Z85850 Personal history of malignant neoplasm of thyroid: Secondary | ICD-10-CM | POA: Diagnosis not present

## 2023-02-01 DIAGNOSIS — N138 Other obstructive and reflux uropathy: Secondary | ICD-10-CM | POA: Diagnosis not present

## 2023-02-01 DIAGNOSIS — E119 Type 2 diabetes mellitus without complications: Secondary | ICD-10-CM | POA: Insufficient documentation

## 2023-02-01 DIAGNOSIS — I10 Essential (primary) hypertension: Secondary | ICD-10-CM | POA: Diagnosis not present

## 2023-02-01 DIAGNOSIS — Z79899 Other long term (current) drug therapy: Secondary | ICD-10-CM

## 2023-02-01 DIAGNOSIS — Z01818 Encounter for other preprocedural examination: Secondary | ICD-10-CM

## 2023-02-01 DIAGNOSIS — R3914 Feeling of incomplete bladder emptying: Secondary | ICD-10-CM | POA: Diagnosis not present

## 2023-02-01 DIAGNOSIS — C61 Malignant neoplasm of prostate: Secondary | ICD-10-CM | POA: Diagnosis not present

## 2023-02-01 DIAGNOSIS — Z01812 Encounter for preprocedural laboratory examination: Secondary | ICD-10-CM

## 2023-02-01 DIAGNOSIS — E1165 Type 2 diabetes mellitus with hyperglycemia: Secondary | ICD-10-CM

## 2023-02-01 HISTORY — PX: HOLEP-LASER ENUCLEATION OF THE PROSTATE WITH MORCELLATION: SHX6641

## 2023-02-01 LAB — GLUCOSE, CAPILLARY
Glucose-Capillary: 172 mg/dL — ABNORMAL HIGH (ref 70–99)
Glucose-Capillary: 169 mg/dL — ABNORMAL HIGH (ref 70–99)

## 2023-02-01 SURGERY — ENUCLEATION, PROSTATE, USING LASER, WITH MORCELLATION
Anesthesia: General | Site: Prostate

## 2023-02-01 MED ORDER — HYDROCODONE-ACETAMINOPHEN 5-325 MG PO TABS: 1.0000 | ORAL_TABLET | Freq: Four times a day (QID) | ORAL | 0 refills | Status: AC | PRN

## 2023-02-01 MED ORDER — FENTANYL CITRATE (PF) 100 MCG/2ML IJ SOLN
INTRAMUSCULAR | Status: AC
  Filled 2023-02-01: qty 2

## 2023-02-01 MED ORDER — ONDANSETRON HCL 4 MG/2ML IJ SOLN
INTRAMUSCULAR | Status: DC | PRN
  Administered 2023-02-01: 4 mg via INTRAVENOUS

## 2023-02-01 MED ORDER — FENTANYL CITRATE (PF) 100 MCG/2ML IJ SOLN
INTRAMUSCULAR | Status: DC | PRN
  Administered 2023-02-01: 50 ug via INTRAVENOUS

## 2023-02-01 MED ORDER — SUGAMMADEX SODIUM 200 MG/2ML IV SOLN
INTRAVENOUS | Status: DC | PRN
  Administered 2023-02-01: 200 mg via INTRAVENOUS

## 2023-02-01 MED ORDER — ACETAMINOPHEN 10 MG/ML IV SOLN
INTRAVENOUS | Status: DC | PRN
  Administered 2023-02-01: 1000 mg via INTRAVENOUS

## 2023-02-01 MED ORDER — OXYCODONE HCL 5 MG/5ML PO SOLN: 5.0000 mg | Freq: Once | ORAL | Status: DC | PRN

## 2023-02-01 MED ORDER — CHLORHEXIDINE GLUCONATE 0.12 % MT SOLN
15.0000 mL | Freq: Once | OROMUCOSAL | Status: AC
  Administered 2023-02-01: 15 mL via OROMUCOSAL

## 2023-02-01 MED ORDER — CEFAZOLIN SODIUM-DEXTROSE 2-4 GM/100ML-% IV SOLN
2.0000 g | INTRAVENOUS | Status: AC
  Administered 2023-02-01: 2 g via INTRAVENOUS

## 2023-02-01 MED ORDER — LIDOCAINE HCL (CARDIAC) PF 100 MG/5ML IV SOSY
PREFILLED_SYRINGE | INTRAVENOUS | Status: DC | PRN
  Administered 2023-02-01: 100 mg via INTRAVENOUS

## 2023-02-01 MED ORDER — DEXAMETHASONE SODIUM PHOSPHATE 10 MG/ML IJ SOLN
INTRAMUSCULAR | Status: DC | PRN
  Administered 2023-02-01: 5 mg via INTRAVENOUS

## 2023-02-01 MED ORDER — SODIUM CHLORIDE 0.9 % IV SOLN: INTRAVENOUS | Status: DC

## 2023-02-01 MED ORDER — CEFAZOLIN SODIUM-DEXTROSE 2-4 GM/100ML-% IV SOLN
INTRAVENOUS | Status: AC
  Filled 2023-02-01: qty 100

## 2023-02-01 MED ORDER — ORAL CARE MOUTH RINSE: 15.0000 mL | Freq: Once | OROMUCOSAL | Status: AC

## 2023-02-01 MED ORDER — SODIUM CHLORIDE 0.9 % IR SOLN
Status: DC | PRN
  Administered 2023-02-01: 21000 mL

## 2023-02-01 MED ORDER — CHLORHEXIDINE GLUCONATE 0.12 % MT SOLN
OROMUCOSAL | Status: AC
  Filled 2023-02-01: qty 15

## 2023-02-01 MED ORDER — ACETAMINOPHEN 10 MG/ML IV SOLN
INTRAVENOUS | Status: AC
  Filled 2023-02-01: qty 100

## 2023-02-01 MED ORDER — GLYCOPYRROLATE 0.2 MG/ML IJ SOLN
INTRAMUSCULAR | Status: DC | PRN
  Administered 2023-02-01: .2 mg via INTRAVENOUS

## 2023-02-01 MED ORDER — ROCURONIUM BROMIDE 100 MG/10ML IV SOLN
INTRAVENOUS | Status: DC | PRN
  Administered 2023-02-01: 50 mg via INTRAVENOUS

## 2023-02-01 MED ORDER — PROPOFOL 10 MG/ML IV BOLUS
INTRAVENOUS | Status: DC | PRN
  Administered 2023-02-01: 200 mg via INTRAVENOUS

## 2023-02-01 MED ORDER — FENTANYL CITRATE (PF) 100 MCG/2ML IJ SOLN: 25.0000 ug | INTRAMUSCULAR | Status: DC | PRN

## 2023-02-01 MED ORDER — OXYCODONE HCL 5 MG PO TABS: 5.0000 mg | ORAL_TABLET | Freq: Once | ORAL | Status: DC | PRN

## 2023-02-01 SURGICAL SUPPLY — 37 items
ADAPTER IRRIG TUBE 2 SPIKE SOL (ADAPTER) ×2 IMPLANT
ADPR TBG 2 SPK PMP STRL ASCP (ADAPTER) ×2
BAG DRN LRG CPC RND TRDRP CNTR (MISCELLANEOUS) ×1
BAG DRN RND TRDRP ANRFLXCHMBR (UROLOGICAL SUPPLIES) ×1
BAG URINE DRAIN 2000ML AR STRL (UROLOGICAL SUPPLIES) ×1 IMPLANT
BAG URO DRAIN 4000ML (MISCELLANEOUS) ×1 IMPLANT
CATH FOLEY 3WAY 30CC 24FR (CATHETERS) ×1
CATH URETL OPEN END 4X70 (CATHETERS) ×1 IMPLANT
CATH URTH STD 24FR FL 3W 2 (CATHETERS) ×1 IMPLANT
CONTAINER COLLECT MORCELLATR (MISCELLANEOUS) ×1 IMPLANT
DRAPE UTILITY 15X26 TOWEL STRL (DRAPES) IMPLANT
ELECT BIVAP BIPO 22/24 DONUT (ELECTROSURGICAL)
ELECTRD BIVAP BIPO 22/24 DONUT (ELECTROSURGICAL) IMPLANT
FIBER LASER MOSES 550 DFL (Laser) ×1 IMPLANT
FILTER OVERFLOW MORCELLATOR (FILTER) ×1 IMPLANT
GLOVE BIOGEL PI IND STRL 7.5 (GLOVE) ×1 IMPLANT
GOWN STRL REUS W/ TWL LRG LVL3 (GOWN DISPOSABLE) ×1 IMPLANT
GOWN STRL REUS W/ TWL XL LVL3 (GOWN DISPOSABLE) ×1 IMPLANT
GOWN STRL REUS W/TWL LRG LVL3 (GOWN DISPOSABLE) ×1
GOWN STRL REUS W/TWL XL LVL3 (GOWN DISPOSABLE) ×1
HOLDER FOLEY CATH W/STRAP (MISCELLANEOUS) ×1 IMPLANT
IV NS IRRIG 3000ML ARTHROMATIC (IV SOLUTION) ×5 IMPLANT
KIT TURNOVER CYSTO (KITS) ×1 IMPLANT
MBRN O SEALING YLW 17 FOR INST (MISCELLANEOUS) ×1
MEMBRANE SLNG YLW 17 FOR INST (MISCELLANEOUS) ×1 IMPLANT
MORCELLATOR COLLECT CONTAINER (MISCELLANEOUS) ×1
MORCELLATOR OVERFLOW FILTER (FILTER) ×1
MORCELLATOR ROTATION 4.75 335 (MISCELLANEOUS) ×1 IMPLANT
PACK CYSTO AR (MISCELLANEOUS) ×1 IMPLANT
SET CYSTO W/LG BORE CLAMP LF (SET/KITS/TRAYS/PACK) ×1 IMPLANT
SET IRRIG Y TYPE TUR BLADDER L (SET/KITS/TRAYS/PACK) ×1 IMPLANT
SLEEVE PROTECTION STRL DISP (MISCELLANEOUS) ×2 IMPLANT
SURGILUBE 2OZ TUBE FLIPTOP (MISCELLANEOUS) ×1 IMPLANT
SYR TOOMEY IRRIG 70ML (MISCELLANEOUS) ×1
SYRINGE TOOMEY IRRIG 70ML (MISCELLANEOUS) ×1 IMPLANT
TUBE PUMP MORCELLATOR PIRANHA (TUBING) ×1 IMPLANT
WATER STERILE IRR 1000ML POUR (IV SOLUTION) ×1 IMPLANT

## 2023-02-01 NOTE — Op Note (Signed)
Date of procedure: 02/01/23  Preoperative diagnosis:  BPH with obstruction  Postoperative diagnosis:  Same  Procedure: HoLEP (Holmium Laser Enucleation of the Prostate)  Surgeon: Legrand Rams, MD  Anesthesia: General  Complications: None  Intraoperative findings:  Large prostate, moderate bladder trabeculations, uncomplicated HOLEP Ureteral orifices and verumontanum intact at conclusion of case  EBL: 20 mL  Specimens: Prostate chips  Enucleation time: 28 minutes  Morcellation time: 10 minutes  Intra-op weight: 50g  Drains: 24 French three-way, 60 cc in balloon  Indication: Srijan Macdonald Razzak is a 72 y.o. patient with BP EH and incomplete bladder emptying with elevated PVRs ranging from 300-432ml, no improvement with Flomax and he opted for HOLEP.  After reviewing the management options for treatment, they elected to proceed with the above surgical procedure(s). We have discussed the potential benefits and risks of the procedure, side effects of the proposed treatment, the likelihood of the patient achieving the goals of the procedure, and any potential problems that might occur during the procedure or recuperation.  We specifically discussed the risks of bleeding, infection, hematuria and clot retention, need for additional procedures, possible overnight hospital stay, temporary urgency and incontinence, rare long-term incontinence, and retrograde ejaculation.  Informed consent has been obtained.   Description of procedure:  The patient was taken to the operating room and general anesthesia was induced.  The patient was placed in the dorsal lithotomy position, prepped and draped in the usual sterile fashion, and preoperative antibiotics(Ancef) were administered.  SCDs were placed for DVT prophylaxis.  A preoperative time-out was performed.   Sissy Hoff sounds were used to gently dilated the urethra up to 21F. The 101 French continuous flow resectoscope was inserted  into the urethra using the visual obturator  The prostate was very large with an elevated bladder neck and obstructing lateral lobes, small median lobe. The bladder was thoroughly inspected and notable for moderate trabeculations but no suspicious lesions.  The ureteral orifices were located in orthotopic position.    The laser was set to 2 J and 60 Hz and early apical release was performed by making a circumferential mucosal incision proximal to the sphincter.  A lambda incision was then made proximal to the verumontanum.  The prostate was enucleated en bloc circumferentially into the bladder.  The capsule was examined and laser was used for meticulous hemostasis.    The 29 French resectoscope was then switched out for the 26 French nephroscope and prostate tissue was morcellated(Piranha) and the tissue sent to pathology.  A 24 French three-way catheter was inserted easily with the aid of a catheter guide, and 60 cc were placed in the balloon.  Urine was clear.  The catheter irrigated easily with a Toomey syringe.  CBI was initiated.   The patient tolerated the procedure well without any immediate complications and was extubated and transferred to the recovery room in stable condition.  Urine was pink on fast CBI.  Disposition: Stable to PACU  Plan: Wean CBI in PACU, anticipate discharge home today with removal in clinic in 2-3 days Needs follow-up with me in 5 weeks for PVR ([place]aving [place] country end of [place] 6 months)  Legrand Rams, MD 02/01/2023

## 2023-02-01 NOTE — Anesthesia Procedure Notes (Signed)
Procedure Name: Intubation Date/Time: 02/01/2023 9:35 AM  Performed by: Mohammed Kindle, CRNAPre-anesthesia Checklist: Patient identified, Emergency Drugs available, Suction available and Patient being monitored Patient Re-evaluated:Patient Re-evaluated prior to induction Oxygen Delivery Method: Circle system utilized Preoxygenation: Pre-oxygenation with 100% oxygen Induction Type: IV induction Ventilation: Mask ventilation without difficulty Laryngoscope Size: McGraph and 3 Grade View: Grade I Tube type: Oral Tube size: 7.0 mm Number of attempts: 1 Airway Equipment and Method: Stylet and Oral airway Placement Confirmation: ETT inserted through vocal cords under direct vision, positive ETCO2, breath sounds checked- equal and bilateral and CO2 detector Secured at: 21 cm Tube secured with: Tape Dental Injury: Teeth and Oropharynx as per pre-operative assessment

## 2023-02-01 NOTE — Anesthesia Preprocedure Evaluation (Addendum)
Anesthesia Evaluation  Patient identified by MRN, date of birth, ID band Patient awake    Reviewed: Allergy & Precautions, NPO status , Patient's Chart, lab work & pertinent test results  History of Anesthesia Complications Negative for: history of anesthetic complications  Airway Mallampati: II  TM Distance: >3 FB Neck ROM: full    Dental no notable dental hx.    Pulmonary neg pulmonary ROS   Pulmonary exam normal        Cardiovascular hypertension, On Medications Normal cardiovascular exam     Neuro/Psych negative neurological ROS  negative psych ROS   GI/Hepatic negative GI ROS, Neg liver ROS,,,  Endo/Other  diabetes, Oral Hypoglycemic AgentsHypothyroidism    Renal/GU      Musculoskeletal   Abdominal   Peds  Hematology negative hematology ROS (+)   Anesthesia Other Findings Past Medical History: No date: BPH with urinary obstruction No date: Diabetes mellitus without complication (HCC) No date: History of thyroid cancer No date: Hypertension No date: Hypothyroidism  Past Surgical History: 06/23/2020: COLONOSCOPY WITH PROPOFOL; N/A     Comment:  Procedure: COLONOSCOPY WITH BIOPSY;  Surgeon: Midge Minium, MD;  Location: [facility];  Service:               Endoscopy;  Laterality: N/A;  priority 4 06/23/2020: POLYPECTOMY; N/A     Comment:  Procedure: POLYPECTOMY;  Surgeon: Midge Minium, MD;                Location: [facility];  Service: Endoscopy;                Laterality: N/A; No date: THYROID SURGERY     Comment:  Total thyroidectomy     Reproductive/Obstetrics negative OB ROS                             Anesthesia Physical Anesthesia Plan  ASA: 2  Anesthesia Plan: General ETT   Post-op Pain Management: Toradol IV (intra-op)* and Ofirmev IV (intra-op)*   Induction: Intravenous  PONV Risk Score and Plan: 2 and Ondansetron,  Dexamethasone, Midazolam and Treatment may vary due to age or medical condition  Airway Management Planned: Oral ETT  Additional Equipment:   Intra-op Plan:   Post-operative Plan: Extubation in OR  Informed Consent: I have reviewed the patients History and Physical, chart, labs and discussed the procedure including the risks, benefits and alternatives for the proposed anesthesia with the patient or authorized representative who has indicated his/her understanding and acceptance.     Dental Advisory Given  Plan Discussed with: Anesthesiologist, CRNA and Surgeon  Anesthesia Plan Comments: (Patient consented for risks of anesthesia including but not limited to:  - adverse reactions to medications - damage to eyes, teeth, lips or other oral mucosa - nerve damage due to positioning  - sore throat or hoarseness - Damage to heart, brain, nerves, lungs, other parts of body or loss of life  Patient voiced understanding.)        Anesthesia Quick Evaluation

## 2023-02-01 NOTE — Anesthesia Postprocedure Evaluation (Signed)
Anesthesia Post Note  Patient: Caleb Ortiz  Procedure(s) Performed: HOLEP-LASER ENUCLEATION OF THE PROSTATE WITH MORCELLATION (Prostate)  Patient location during evaluation: PACU Anesthesia Type: General Level of consciousness: awake and alert Pain management: pain level controlled Vital Signs Assessment: post-procedure vital signs reviewed and stable Respiratory status: spontaneous breathing, nonlabored ventilation, respiratory function stable and patient connected to nasal cannula oxygen Cardiovascular status: blood pressure returned to baseline and stable Postop Assessment: no apparent nausea or vomiting Anesthetic complications: no   No notable events documented.   Last Vitals:  Vitals:   02/01/23 1046 02/01/23 1100  BP: 120/72 124/71  Pulse: 83 78  Resp: (!) 9 11  Temp:    SpO2: 100% 96%    Last Pain:  Vitals:   02/01/23 0845  TempSrc: Temporal  PainSc: 0-No pain                 Louie Boston

## 2023-02-01 NOTE — Transfer of Care (Signed)
Immediate Anesthesia Transfer of Care Note  Patient: Caleb Ortiz  Procedure(s) Performed: HOLEP-LASER ENUCLEATION OF THE PROSTATE WITH MORCELLATION (Prostate)  Patient Location: PACU  Anesthesia Type:General  Level of Consciousness: awake, drowsy, and patient cooperative  Airway & Oxygen Therapy: Patient Spontanous Breathing and Patient connected to face mask oxygen  Post-op Assessment: Report given to RN and Post -op Vital signs reviewed and stable  Post vital signs: Reviewed and stable  Last Vitals:  Vitals Value Taken Time  BP 125/71 02/01/23 1041  Temp    Pulse 86 02/01/23 1044  Resp 14 02/01/23 1044  SpO2 100 % 02/01/23 1044  Vitals shown include unfiled device data.  Last Pain:  Vitals:   02/01/23 0845  TempSrc: Temporal  PainSc: 0-No pain         Complications: No notable events documented.

## 2023-02-01 NOTE — Discharge Instructions (Signed)

## 2023-02-01 NOTE — H&P (Signed)
02/01/23 9:20 AM   Caleb Ortiz [birth year] [id number]  CC: BPH, incomplete bladder emptying  HPI: He originally presented with an elevated PSA of 7.97 that remained elevated at 7.8(24% free) and underwent a prostate biopsy in November 2021 showing a 67 g gland, small median lobe, PSA density of 0.12, and all cores showed only benign tissue.  PSA has been stable since that time, most recently 7.7 in December 2023.  He also has a history of a vasovagal episode with DRE.   He has had worsening urinary symptoms over the last 1 to 2 years with weak stream, urgency, occasional urge incontinence, frequency, nocturia 5+ times per night.  When I saw him in December 2023 his PVR was elevated at , but he was leaving later that week for a long trip to [place] and our options were limited.  He opted for trial of Flomax.  He has noticed some mild improvement in his symptoms on the Flomax, but PVR remains significantly elevated at .  He opted for HOLEP.     PMH: Past Medical History:  Diagnosis Date   BPH with urinary obstruction    Diabetes mellitus without complication (HCC)    History of thyroid cancer    Hypertension    Hypothyroidism     Surgical History: Past Surgical History:  Procedure Laterality Date   COLONOSCOPY WITH PROPOFOL N/A 06/23/2020   Procedure: COLONOSCOPY WITH BIOPSY;  Surgeon: Midge Minium, MD;  Location: [facility];  Service: Endoscopy;  Laterality: N/A;  priority 4   POLYPECTOMY N/A 06/23/2020   Procedure: POLYPECTOMY;  Surgeon: Midge Minium, MD;  Location: [facility];  Service: Endoscopy;  Laterality: N/A;   THYROID SURGERY     Total thyroidectomy    Family History: Family History  Problem Relation Age of Onset   Hypothyroidism Neg Hx        2 SONS    Social History:  reports that he has never smoked. He has never been exposed to tobacco smoke. He has never used smokeless tobacco. He reports that he does not currently  use alcohol. He reports that he does not use drugs.  Physical Exam: BP (!) 141/74   Pulse 62   Temp 97.7 F (36.5 C) (Temporal)   Resp 18   Ht 5\' 8"  (1.727 m)   Wt 77.1 kg   SpO2 98%   BMI 25.84 kg/m    Constitutional:  Alert and oriented, No acute distress. Cardiovascular: Regular rate and rhythm Respiratory: Clear to auscultation bilaterally GI: Abdomen is soft, nontender, nondistended, no abdominal masses   Laboratory Data: Culture 01/18/2023 no growth  Assessment & Plan:   72 year old male with worsening urinary symptoms, documented incomplete emptying with PVRs 300-425ml despite Flomax, history of elevated PSA with negative biopsy, prostate measured 70 g in 2021.  He has had vasovagal episodes with DRE in clinic and he opted to defer cystoscopy and repeat prostate sizing, we discussed the low, but not 0, risk of additional or different pathology like urethral stricture.  We discussed the risks and benefits of HoLEP at length.  The procedure requires general anesthesia and takes 1 to 2 hours, and a holmium laser is used to enucleate the prostate and push this tissue into the bladder.  A morcellator is then used to remove this tissue, which is sent for pathology.  The vast majority(>95%) of patients are able to discharge the same day with a catheter in place for 2 to 3 days, and will  follow-up in clinic for a voiding trial.  We specifically discussed the risks of bleeding, infection, retrograde ejaculation, temporary urgency and urge incontinence, very low risk of long-term incontinence, urethral stricture/bladder neck contracture, pathologic evaluation of prostate tissue and possible detection of prostate cancer or other malignancy, and possible need for additional procedures.  HOLEP today  Legrand Rams, MD 02/01/2023  [facility] [address], [address] [place], [place] [address] [phone]

## 2023-02-02 ENCOUNTER — Encounter: Payer: Self-pay | Admitting: Urology

## 2023-02-04 ENCOUNTER — Encounter: Payer: Self-pay | Admitting: Physician Assistant

## 2023-02-04 ENCOUNTER — Ambulatory Visit: Payer: Medicare Other | Admitting: Physician Assistant

## 2023-02-04 VITALS — Ht 70.0 in | Wt 169.0 lb

## 2023-02-04 DIAGNOSIS — N401 Enlarged prostate with lower urinary tract symptoms: Secondary | ICD-10-CM

## 2023-02-04 DIAGNOSIS — N138 Other obstructive and reflux uropathy: Secondary | ICD-10-CM

## 2023-02-04 NOTE — Progress Notes (Signed)
Catheter Removal  Patient is present today for a catheter removal.  60ml of water was drained from the balloon. A 24FR three-way foley cath was removed from the bladder, no complications were noted. Patient tolerated well.  Performed by: Carman Ching, PA-C   Additional notes: Counseled patient on normal postoperative findings including dysuria, gross hematuria, and urinary urgency/leakage. Counseled patient to begin Kegel exercises 3x10 sets daily to increase urinary control and wear absorbent products as needed for security. Written and verbal resources provided today. Surgical pathology pending; will defer to Dr. Richardo Hanks to share results when available.   Follow up/ Additional notes: Return in about 5 weeks (around 03/11/2023) for Postop f/u with Dr. Richardo Hanks with IPSS, PVR.

## 2023-02-04 NOTE — Patient Instructions (Addendum)
Congratulations on your recent HOLEP procedure! As discussed in clinic today, these are the three normal postoperative findings after this surgery: Burning or pain with urination: This typically resolves within 1 week of surgery. If you are still having significant pain with urination 10 days after surgery, please call our clinic. We may need to check you for a urinary tract infection at that point, though this is rare. You may take the over-the-counter Azo Urinary Pain Relief for burning for the next couple of days (photo below). Blood in the urine: This may either come and go or steadily improve before going away, but typically resolves completely within 3 weeks of surgery. If you are on blood thinners, it may take longer for the bleeding to resolve. Please note that you may find that you pass clumps of tissue or debris around 10-14 days after surgery associated with some new bleeding. This is due to sloughing of your postoperative scab and is also normal. If at any point you start to pass dark red urine; thick, ketchup-like urine; or large blood clots around the size of your palm, please call our office immediately. Urinary leakage or urgency: This tends to improve with time, with most patients becoming dry within around 3 months of surgery. You may wear absorbant underwear or liners for security during this time. To help you get dry faster, please make sure you are completing your Kegel exercises as instructed, with a set of 10 exercises completed up to three times daily. Further information on Kegel exercises can be found in the back of this packet.

## 2023-03-14 ENCOUNTER — Encounter: Payer: Self-pay | Admitting: Urology

## 2023-03-14 ENCOUNTER — Ambulatory Visit (INDEPENDENT_AMBULATORY_CARE_PROVIDER_SITE_OTHER): Payer: Medicare Other | Admitting: Urology

## 2023-03-14 VITALS — BP 136/81 | HR 66 | Ht 67.0 in | Wt 161.0 lb

## 2023-03-14 DIAGNOSIS — N401 Enlarged prostate with lower urinary tract symptoms: Secondary | ICD-10-CM

## 2023-03-14 DIAGNOSIS — E890 Postprocedural hypothyroidism: Secondary | ICD-10-CM | POA: Diagnosis not present

## 2023-03-14 DIAGNOSIS — Z85850 Personal history of malignant neoplasm of thyroid: Secondary | ICD-10-CM | POA: Diagnosis not present

## 2023-03-14 DIAGNOSIS — Z87438 Personal history of other diseases of male genital organs: Secondary | ICD-10-CM | POA: Diagnosis not present

## 2023-03-14 DIAGNOSIS — Z1389 Encounter for screening for other disorder: Secondary | ICD-10-CM | POA: Diagnosis not present

## 2023-03-14 DIAGNOSIS — Z0001 Encounter for general adult medical examination with abnormal findings: Secondary | ICD-10-CM | POA: Diagnosis not present

## 2023-03-14 DIAGNOSIS — Z09 Encounter for follow-up examination after completed treatment for conditions other than malignant neoplasm: Secondary | ICD-10-CM | POA: Diagnosis not present

## 2023-03-14 DIAGNOSIS — E1165 Type 2 diabetes mellitus with hyperglycemia: Secondary | ICD-10-CM | POA: Diagnosis not present

## 2023-03-14 DIAGNOSIS — I10 Essential (primary) hypertension: Secondary | ICD-10-CM | POA: Diagnosis not present

## 2023-03-14 DIAGNOSIS — C61 Malignant neoplasm of prostate: Secondary | ICD-10-CM

## 2023-03-14 DIAGNOSIS — E785 Hyperlipidemia, unspecified: Secondary | ICD-10-CM | POA: Diagnosis not present

## 2023-03-14 LAB — BLADDER SCAN AMB NON-IMAGING

## 2023-03-14 NOTE — Progress Notes (Signed)
   03/14/2023 3:32 PM   [name] [birth year] [id number]  Reason for visit: Follow up HoLEP  HPI: 72 year old male who originally presented with an elevated PSA of 7.8(24% free) and underwent a prostate biopsy in November 2021 showing only benign tissue.  PSA had been stable, but he developed worsening urinary symptoms with weak stream, urgency, occasional urge incontinence, frequency, nocturia 5+ times per night, and PVRs were elevated at .  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  He has been doing well since surgery, voiding with a strong stream, PVR normal today at 83ml.  Still having some urgency and occasional urge incontinence.  Denies any stress incontinence.  Nocturia improved to 1-2 times overnight.  Reassurance provided regarding urinary symptoms, could consider OAB medication in the future if persistent after 3 to 4 months.  We discussed his new diagnosis of low-volume low risk prostate cancer incidentally found on HOLEP.  Reassurance provided this would be very unlikely to cause symptoms or spread in the future, and PSA monitoring alone would be recommended.  He would like to avoid PSA blood draw today, but would be willing to have PSA drawn at his next visit.  He is going back [place] next [place], and would like to return in 6 to 7 months for PVR and PSA prior.  RTC 6 to 7 months PSA prior, PVR  Sondra Come, MD  [facility] [address], [address] [place], [place] [address] [phone]

## 2023-04-25 ENCOUNTER — Ambulatory Visit: Payer: Medicare Other | Admitting: Urology

## 2023-07-05 ENCOUNTER — Other Ambulatory Visit: Payer: Self-pay | Admitting: Urology

## 2023-10-29 DIAGNOSIS — E1165 Type 2 diabetes mellitus with hyperglycemia: Secondary | ICD-10-CM | POA: Diagnosis not present

## 2023-10-29 DIAGNOSIS — Z85850 Personal history of malignant neoplasm of thyroid: Secondary | ICD-10-CM | POA: Diagnosis not present

## 2023-10-29 DIAGNOSIS — E785 Hyperlipidemia, unspecified: Secondary | ICD-10-CM | POA: Diagnosis not present

## 2023-10-29 DIAGNOSIS — E559 Vitamin D deficiency, unspecified: Secondary | ICD-10-CM | POA: Diagnosis not present

## 2023-10-29 DIAGNOSIS — I10 Essential (primary) hypertension: Secondary | ICD-10-CM | POA: Diagnosis not present

## 2023-10-29 DIAGNOSIS — Z0001 Encounter for general adult medical examination with abnormal findings: Secondary | ICD-10-CM | POA: Diagnosis not present

## 2023-10-29 DIAGNOSIS — Z1389 Encounter for screening for other disorder: Secondary | ICD-10-CM | POA: Diagnosis not present

## 2023-10-29 DIAGNOSIS — E890 Postprocedural hypothyroidism: Secondary | ICD-10-CM | POA: Diagnosis not present

## 2023-10-31 ENCOUNTER — Telehealth: Payer: Self-pay

## 2023-10-31 DIAGNOSIS — E1165 Type 2 diabetes mellitus with hyperglycemia: Secondary | ICD-10-CM | POA: Diagnosis not present

## 2023-10-31 DIAGNOSIS — I10 Essential (primary) hypertension: Secondary | ICD-10-CM | POA: Diagnosis not present

## 2023-10-31 DIAGNOSIS — E785 Hyperlipidemia, unspecified: Secondary | ICD-10-CM | POA: Diagnosis not present

## 2023-10-31 DIAGNOSIS — D61818 Other pancytopenia: Secondary | ICD-10-CM | POA: Diagnosis not present

## 2023-10-31 NOTE — Patient Outreach (Signed)
 Submitted faxed referral for assistance.  [facility]  Population Health Care Management Assistant  Direct Dial: [phone]  Fax: [phone] Website: [email or web address]

## 2023-11-01 ENCOUNTER — Telehealth: Payer: Self-pay

## 2023-11-01 NOTE — Progress Notes (Signed)
   11/01/2023  Patient ID: Caleb Ortiz, male   DOB: 06/22/1951, 73 y.o.   MRN: [id number]  Contacted patient regarding referral for medication access from Benetta Spar, MD .   Left patient a voicemail to return my call at their convenience  Harlon Flor, PharmD Clinical Pharmacist  [phone]

## 2023-11-05 ENCOUNTER — Other Ambulatory Visit: Payer: Self-pay

## 2023-11-05 ENCOUNTER — Encounter: Admitting: Oncology

## 2023-11-05 ENCOUNTER — Other Ambulatory Visit

## 2023-11-05 DIAGNOSIS — R972 Elevated prostate specific antigen [PSA]: Secondary | ICD-10-CM

## 2023-11-05 NOTE — Progress Notes (Signed)
   11/05/2023  Patient ID: Caleb Ortiz, male   DOB: 07/14/1951, 73 y.o.   MRN: [id number]  Contacted patient regarding referral for medication access from Fanta, Tesfaye Demissie, MD .   Spoke to patient. Screened patient for medication assistance. Based on screening, patient qualifies for LIS. He needs time to gather income documents. Will apply to Medicare Extra-Help (LIS) tomorrow morning, 11/06/23 at 9:30 am.  Livia Riffle, PharmD Clinical Pharmacist  [phone]

## 2023-11-06 ENCOUNTER — Other Ambulatory Visit: Payer: Self-pay

## 2023-11-06 LAB — PSA: Prostate Specific Ag, Serum: 0.8 ng/mL (ref 0.0–4.0)

## 2023-11-06 NOTE — Progress Notes (Signed)
   11/06/2023 Name: Caleb Ortiz MRN: [id number] DOB: [birth year]  Chief Complaint  Patient presents with   Medication Assistance    Tradjenta    Carlie Tessema Coca is a 73 y.o. year old male who presented for a telephone visit.   They were referred to the pharmacist by their PCP for assistance in managing medication access.    Care Team: Primary Care Provider: Fanta, Tesfaye Demissie, MD ; Next Scheduled Visit: 11/26/23   Medication Access/Adherence  Current Pharmacy:  [facility] [phone] - [place], [place] - [address] [place] [address] Phone: [phone] Fax: [phone]   Patient reports affordability concerns with their medications: Yes  Patient reports access/transportation concerns to their pharmacy: No  Patient reports adherence concerns with their medications:  No    Medication Review Findings:  Tradjenta 5 mg prescribed on 10/30/23. Patient cannot afford medication.   Medication Assistance Findings:  Medication assistance needs identified: Tradjenta 5 mg     Additional medication assistance options reviewed with patient as warranted:  No other options identified   Objective:  No results found for: "HGBA1C"  Lab Results  Component Value Date   CREATININE 0.72 01/23/2023   BUN 14 01/23/2023   NA 138 01/23/2023   K 3.5 01/23/2023   CL 104 01/23/2023   CO2 25 01/23/2023    Lab Results  Component Value Date   CHOL 186 05/29/2021   HDL 45 05/29/2021   LDLCALC 115 (H) 05/29/2021   TRIG 147 05/29/2021   CHOLHDL 4.1 05/29/2021    Medications Reviewed Today     Reviewed by Livia Riffle, [facility] (Pharmacist) on 11/06/23 at 1004  Med List Status: <None>   Medication Order Taking? Sig Documenting Provider Last Dose Status Informant  acetaminophen (TYLENOL) 325 MG tablet [id number] Yes Take 650 mg by mouth every 6 (six) hours as needed for moderate pain. [provider] Taking  Active   glucose blood test strip [id number] Yes CHECK BLOOD SUGAR TWICE DAILY [provider] Taking Active            Med Note Tobey Forte, Orion Birks D   Wed Nov 06, 2023 10:01 AM) Accu-Chek Guide)  levothyroxine (SYNTHROID) 100 MCG tablet [id number] Yes Take 100 mcg by mouth daily. [provider] Taking Active   losartan-hydrochlorothiazide (HYZAAR) 100-12.5 MG tablet [id number] Yes Take 1 tablet by mouth daily. [provider] Taking Active   metFORMIN (GLUCOPHAGE XR) 500 MG 24 hr tablet [id number] Yes Take 1 tablet (500 mg total) by mouth 2 (two) times daily. Nida, Gebreselassie W, MD Taking Active   rosuvastatin (CRESTOR) 10 MG tablet [id number] Yes Take 10 mg by mouth at bedtime. [provider] Taking Active              Plan: I will route patient assistance request to pharmacy technician who will coordinate patient assistance program application process for medications listed above.  [facility] pharmacy technician will assist with obtaining all required documents from both patient and provider(s) and submit application(s) once completed.      Follow Up Plan: Will follow up with PCP to see if samples are covered until the patient is approved for Tradjenta PAP.  Livia Riffle, PharmD Clinical Pharmacist  [phone]

## 2023-11-07 ENCOUNTER — Other Ambulatory Visit: Payer: Self-pay

## 2023-11-08 ENCOUNTER — Other Ambulatory Visit: Payer: Medicare Other

## 2023-11-11 ENCOUNTER — Other Ambulatory Visit (HOSPITAL_COMMUNITY): Payer: Self-pay

## 2023-11-11 ENCOUNTER — Telehealth: Payer: Self-pay

## 2023-11-11 NOTE — Telephone Encounter (Signed)
 PAP: Patient assistance application for Tradjenta through [employer]) has been mailed to pt's home address on file. Provider portion of application will be faxed to provider's office.  Provider portion of application has been faxed to Dr. Tesfaye Fanta for review

## 2023-11-13 ENCOUNTER — Inpatient Hospital Stay

## 2023-11-13 ENCOUNTER — Inpatient Hospital Stay: Attending: Oncology | Admitting: Oncology

## 2023-11-13 VITALS — BP 131/70 | HR 72 | Temp 97.3°F | Resp 16 | Ht 68.0 in | Wt 172.4 lb

## 2023-11-13 DIAGNOSIS — D696 Thrombocytopenia, unspecified: Secondary | ICD-10-CM | POA: Insufficient documentation

## 2023-11-13 DIAGNOSIS — D61818 Other pancytopenia: Secondary | ICD-10-CM | POA: Diagnosis not present

## 2023-11-13 DIAGNOSIS — C73 Malignant neoplasm of thyroid gland: Secondary | ICD-10-CM | POA: Insufficient documentation

## 2023-11-13 DIAGNOSIS — D72819 Decreased white blood cell count, unspecified: Secondary | ICD-10-CM | POA: Insufficient documentation

## 2023-11-13 DIAGNOSIS — Z8546 Personal history of malignant neoplasm of prostate: Secondary | ICD-10-CM | POA: Insufficient documentation

## 2023-11-13 LAB — COMPREHENSIVE METABOLIC PANEL WITH GFR
ALT: 18 U/L (ref 0–44)
AST: 20 U/L (ref 15–41)
Albumin: 4.2 g/dL (ref 3.5–5.0)
Alkaline Phosphatase: 38 U/L (ref 38–126)
Anion gap: 12 (ref 5–15)
BUN: 22 mg/dL (ref 8–23)
CO2: 26 mmol/L (ref 22–32)
Calcium: 9.7 mg/dL (ref 8.9–10.3)
Chloride: 101 mmol/L (ref 98–111)
Creatinine, Ser: 0.87 mg/dL (ref 0.61–1.24)
GFR, Estimated: >60 mL/min (ref >60)
Glucose, Bld: 110 mg/dL — ABNORMAL HIGH (ref 70–99)
Potassium: 4.1 mmol/L (ref 3.5–5.1)
Sodium: 139 mmol/L (ref 135–145)
Total Bilirubin: 0.9 mg/dL (ref 0.0–1.2)
Total Protein: 6.9 g/dL (ref 6.5–8.1)

## 2023-11-13 LAB — CBC WITH DIFFERENTIAL/PLATELET
Abs Immature Granulocytes: 0.01 10*3/uL (ref 0.00–0.07)
Basophils Absolute: 0 10*3/uL (ref 0.0–0.1)
Basophils Relative: 1 %
Eosinophils Absolute: 0 10*3/uL (ref 0.0–0.5)
Eosinophils Relative: 1 %
HCT: 43.7 % (ref 39.0–52.0)
Hemoglobin: 14.3 g/dL (ref 13.0–17.0)
Immature Granulocytes: 0 %
Lymphocytes Relative: 35 %
Lymphs Abs: 1.3 10*3/uL (ref 0.7–4.0)
MCH: 31.3 pg (ref 26.0–34.0)
MCHC: 32.7 g/dL (ref 30.0–36.0)
MCV: 95.6 fL (ref 80.0–100.0)
Monocytes Absolute: 0.3 10*3/uL (ref 0.1–1.0)
Monocytes Relative: 9 %
Neutro Abs: 2.1 10*3/uL (ref 1.7–7.7)
Neutrophils Relative %: 54 %
Platelets: 110 10*3/uL — ABNORMAL LOW (ref 150–400)
RBC: 4.57 MIL/uL (ref 4.22–5.81)
RDW: 12.2 % (ref 11.5–15.5)
WBC: 3.8 10*3/uL — ABNORMAL LOW (ref 4.0–10.5)
nRBC: 0 % (ref 0.0–0.2)

## 2023-11-13 LAB — HEPATITIS PANEL, ACUTE
HCV Ab: NONREACTIVE
Hep A IgM: NONREACTIVE
Hep B C IgM: NONREACTIVE
Hepatitis B Surface Ag: NONREACTIVE

## 2023-11-13 LAB — IRON AND TIBC
Iron: 84 ug/dL (ref 45–182)
Saturation Ratios: 25 % (ref 17.9–39.5)
TIBC: 343 ug/dL (ref 250–450)
UIBC: 259 ug/dL

## 2023-11-13 LAB — VITAMIN B12: Vitamin B-12: 320 pg/mL (ref 180–914)

## 2023-11-13 LAB — FERRITIN: Ferritin: 194 ng/mL (ref 24–336)

## 2023-11-13 LAB — FOLATE: Folate: 6.7 ng/mL (ref >5.9)

## 2023-11-13 NOTE — Patient Instructions (Signed)
 VISIT SUMMARY:  You were seen today to evaluate your low white blood cell count. You have a history of prostate and thyroid  cancer, both treated successfully, and you are managing type 2 diabetes. You have not experienced any recent infections or significant symptoms related to your low white blood cell count.  YOUR PLAN:  -LEUKOPENIA: Leukopenia means you have a low white blood cell count, which can be due to various reasons including infections, bone marrow issues, or nutritional deficiencies. We will conduct blood tests to check for leukemia and nutritional deficiencies (iron, B12, folate). Please review any previous white blood cell counts if you have them. We will discuss the results in two weeks.  -THROMBOCYTOPENIA: Thrombocytopenia means you have a low platelet count, which can be due to nutritional deficiencies or bone marrow disorders. We will conduct blood tests to check for these issues.  -PROSTATE CANCER: Your prostate cancer was treated with surgery, and your PSA levels have been normal since the procedure. No further treatment is needed at this time, but regular monitoring will continue.  -THYROID  CANCER: Your thyroid  cancer was treated with surgery and radioiodine therapy, and it is currently under control. Regular follow-up will continue.  INSTRUCTIONS:  Please follow up in two weeks to discuss the results of your blood tests. If you have any previous records of your white blood cell counts, please bring them to your next appointment.

## 2023-11-13 NOTE — Progress Notes (Unsigned)
  [facility] at [facility]  HEMATOLOGY NEW VISIT  Fanta, Nancee Awe, MD  REASON FOR REFERRAL: Pancytopenia  SUMMARY OF HEMATOLOGIC HISTORY: ***   HISTORY OF PRESENT ILLNESS: Caleb Ortiz 73 y.o. male referred for ***. The patient is accompanied by    I have reviewed the past medical history, past surgical history, social history and family history with the patient   ALLERGIES:  has no known allergies.  MEDICATIONS:  Current Outpatient Medications  Medication Sig Dispense Refill   acetaminophen  (TYLENOL ) 325 MG tablet Take 650 mg by mouth every 6 (six) hours as needed for moderate pain.     glucose blood test strip CHECK BLOOD SUGAR TWICE DAILY     levothyroxine  (SYNTHROID ) 100 MCG tablet Take 100 mcg by mouth daily.     losartan -hydrochlorothiazide (HYZAAR) 100-12.5 MG tablet Take 1 tablet by mouth daily.     metFORMIN  (GLUCOPHAGE  XR) 500 MG 24 hr tablet Take 1 tablet (500 mg total) by mouth 2 (two) times daily. 360 tablet 1   rosuvastatin  (CRESTOR ) 10 MG tablet Take 10 mg by mouth at bedtime.     No current facility-administered medications for this visit.     REVIEW OF SYSTEMS:   Constitutional: Denies fevers, chills or night sweats Eyes: Denies blurriness of vision Ears, nose, mouth, throat, and face: Denies mucositis or sore throat Respiratory: Denies cough, dyspnea or wheezes Cardiovascular: Denies palpitation, chest discomfort or lower extremity swelling Gastrointestinal:  Denies nausea, heartburn or change in bowel habits Skin: Denies abnormal skin rashes Lymphatics: Denies new lymphadenopathy or easy bruising Neurological:Denies numbness, tingling or new weaknesses Behavioral/Psych: Mood is stable, no new changes  All other systems were reviewed with the patient and are negative.  PHYSICAL EXAMINATION:   There were no vitals filed for this visit.  GENERAL:alert, no distress and comfortable SKIN: skin color,  texture, turgor are normal, no rashes or significant lesions EYES: normal, Conjunctiva are pink and non-injected, sclera clear OROPHARYNX:no exudate, no erythema and lips, buccal mucosa, and tongue normal  NECK: supple, thyroid  normal size, non-tender, without nodularity LYMPH:  no palpable lymphadenopathy in the cervical, axillary or inguinal LUNGS: clear to auscultation and percussion with normal breathing effort HEART: regular rate & rhythm and no murmurs and no lower extremity edema ABDOMEN:abdomen soft, non-tender and normal bowel sounds Musculoskeletal:no cyanosis of digits and no clubbing  NEURO: alert & oriented x 3 with fluent speech, no focal motor/sensory deficits  LABORATORY DATA:  I have reviewed the data as listed  Labs from primary care office drawn on 10/30/2023: CBC: WBC: 2.7, hemoglobin: 15.3, hematocrit: 44.5, MCV: 91.6, ALC: 1131, ANC: 1283, platelets: 110 CMP: Calcium : 9.6, LFTs: WNL, creatinine: 0.92  Labs from 03/12/2023: CBC: Hemoglobin: 13, hematocrit: 38.6, platelets: 107, ALC: 1246, ANC: 1250   ASSESSMENT & PLAN:  Patient is a 73 y.o. male referred for pancytopenia  No problem-specific Assessment & Plan notes found for this encounter.   No orders of the defined types were placed in this encounter.   The total time spent in the appointment was {CHL ONC TIME VISIT - ZOXWR:[id number]} encounter with patients including review of chart and various tests results, discussions about plan of care and coordination of care plan   All questions were answered. The patient knows to call the clinic with any problems, questions or concerns. No barriers to learning was detected.   Eduardo Grade, MD 4/23/202510:27 AM

## 2023-11-14 ENCOUNTER — Ambulatory Visit: Payer: Self-pay | Admitting: Urology

## 2023-11-14 VITALS — BP 133/77 | HR 65 | Ht 68.0 in | Wt 170.0 lb

## 2023-11-14 DIAGNOSIS — N138 Other obstructive and reflux uropathy: Secondary | ICD-10-CM | POA: Diagnosis not present

## 2023-11-14 DIAGNOSIS — R972 Elevated prostate specific antigen [PSA]: Secondary | ICD-10-CM

## 2023-11-14 DIAGNOSIS — D696 Thrombocytopenia, unspecified: Secondary | ICD-10-CM | POA: Insufficient documentation

## 2023-11-14 DIAGNOSIS — N401 Enlarged prostate with lower urinary tract symptoms: Secondary | ICD-10-CM | POA: Diagnosis not present

## 2023-11-14 DIAGNOSIS — C61 Malignant neoplasm of prostate: Secondary | ICD-10-CM | POA: Diagnosis not present

## 2023-11-14 DIAGNOSIS — D72819 Decreased white blood cell count, unspecified: Secondary | ICD-10-CM | POA: Insufficient documentation

## 2023-11-14 DIAGNOSIS — Z8546 Personal history of malignant neoplasm of prostate: Secondary | ICD-10-CM | POA: Insufficient documentation

## 2023-11-14 LAB — MISC LABCORP TEST (SEND OUT): Labcorp test code: 6025

## 2023-11-14 LAB — BLADDER SCAN AMB NON-IMAGING: Scan Result: 205

## 2023-11-14 NOTE — Progress Notes (Signed)
   11/14/2023 4:00 PM   Ananias Marlou Sims 08/27/1950 [id number]  Reason for visit: Follow up BPH/LUTS, low risk prostate cancer  HPI: 73 year old male who originally presented with an elevated PSA of 7.8, 24% free and underwent a prostate biopsy in November 2021 showing only benign tissue.  He then developed worsening urinary symptoms over the next 2 years with weak stream, urgency, nocturia 5 times per night, and PVRs were elevated around .  He underwent uncomplicated HOLEP in July 2024 with removal of 50 g of tissue, less than 5% showed low risk 3+3= 6 prostate cancer.  He denies any significant urinary symptoms and doing well since surgery.  Bladder scan today 200 mL but he denies the urge to void and has not voided over the last few hours.  He denies any incontinence.  Nocturia 0-1 time overnight.  Overall very satisfied with urinary symptoms.  PSA decreased appropriately after HOLEP to 0.8 from April 2025, can continue to check yearly PSA for active surveillance of low risk prostate cancer, we discussed very low likelihood of needing additional treatments.  RTC 1 year PSA prior, PVR (he prefers to call to schedule as he travels out of the country frequently)  Lawerence Pressman, MD  [facility] [address] [place], [place] [address] [phone]

## 2023-11-14 NOTE — Assessment & Plan Note (Signed)
 Patient has mild leukopenia with no neutropenia or lymphopenia. Differential includes acute infection, bone marrow failure, and benign ethnic neutropenia.  No infection signs or systemic symptoms.  Consider nutritional deficiencies.  - Will order nutritional studies including iron panel, ferritin, vitamin B12, folate and flow cytometry -Will also workup for benign ethnic neutropenia with duffy null antigen testing  Return in 2 weeks discussed results

## 2023-11-14 NOTE — Assessment & Plan Note (Signed)
 Patient had papillary thyroid  carcinoma s/p resection in [place] and RAI here at [facility] Being followed by Dr. Monte Antonio  - Continue to follow with Dr. Nida

## 2023-11-14 NOTE — Assessment & Plan Note (Signed)
 Patient has a history of prostate cancer s/p resection.  No radiation therapy Recent PSA normal  - Follows with primary care who does frequent PSA checks

## 2023-11-14 NOTE — Assessment & Plan Note (Signed)
 Thrombocytopenia with slightly low platelet count.  No bleeding history.  Differential includes nutritional deficiencies and bone marrow disorders.  - Order blood tests for nutritional deficiencies and will also obtain hepatitis panel.

## 2023-11-19 NOTE — Telephone Encounter (Signed)
 Received provider portion of patient application from Dr. Fanta

## 2023-11-20 LAB — SURGICAL PATHOLOGY

## 2023-11-20 LAB — FLOW CYTOMETRY

## 2023-11-27 ENCOUNTER — Inpatient Hospital Stay: Attending: Oncology | Admitting: Oncology

## 2023-11-27 VITALS — BP 130/68 | HR 66 | Temp 97.8°F | Resp 16 | Ht 67.91 in | Wt 169.1 lb

## 2023-11-27 DIAGNOSIS — Z79899 Other long term (current) drug therapy: Secondary | ICD-10-CM | POA: Insufficient documentation

## 2023-11-27 DIAGNOSIS — D72819 Decreased white blood cell count, unspecified: Secondary | ICD-10-CM

## 2023-11-27 DIAGNOSIS — D696 Thrombocytopenia, unspecified: Secondary | ICD-10-CM | POA: Diagnosis not present

## 2023-11-27 DIAGNOSIS — E538 Deficiency of other specified B group vitamins: Secondary | ICD-10-CM | POA: Diagnosis not present

## 2023-11-27 DIAGNOSIS — C73 Malignant neoplasm of thyroid gland: Secondary | ICD-10-CM | POA: Diagnosis not present

## 2023-11-27 DIAGNOSIS — K5900 Constipation, unspecified: Secondary | ICD-10-CM | POA: Insufficient documentation

## 2023-11-27 DIAGNOSIS — Z79890 Hormone replacement therapy (postmenopausal): Secondary | ICD-10-CM | POA: Diagnosis not present

## 2023-11-27 NOTE — Patient Instructions (Signed)
 VISIT SUMMARY:  You came in today for a follow-up on your low platelet and white blood cell counts. We discussed your recent test results and your overall health. You mentioned that you are planning to travel to [place] soon and that you have been experiencing constipation despite using Miralax.  YOUR PLAN:  -LEUKOPENIA AND THROMBOCYTOPENIA: Leukopenia means low white blood cell count, and thrombocytopenia means low platelet count. These conditions have been persistent for 18 months without acute symptoms. We will order an abdominal ultrasound to check for any enlargement of your spleen or liver before your travel. We will repeat your blood work and flow cytometry in six months. If your blood counts remain low or if the ultrasound shows any abnormalities, we may consider a bone marrow biopsy.  -VITAMIN B12 DEFICIENCY: You have a mild vitamin B12 deficiency, which is not the primary cause of your low blood counts. We will start you on vitamin B12 supplements to address this deficiency.   INSTRUCTIONS:  Please schedule an abdominal ultrasound before your travel to [place]. We will repeat your blood work and flow cytometry in six months. If you experience any new symptoms or have concerns, please contact our office.

## 2023-11-27 NOTE — Progress Notes (Signed)
 [facility] at [facility]  HEMATOLOGY FOLLOW-UP VISIT  Caleb Ortiz, Nancee Awe, MD  REASON FOR FOLLOW-UP: Leukopenia and thrombocytopenia  ASSESSMENT & PLAN:  Patient is a 73 y.o. male following for leukopenia and thrombocytopenia  Malignant neoplasm of thyroid  gland [facility]) Patient had papillary thyroid  carcinoma s/p resection in [place] and RAI here at [facility] Being followed by Dr. Monte Antonio  - Continue to follow with Dr. Monte Antonio  Leukopenia Patient has mild leukopenia with no neutropenia or lymphopenia. Flow cytometry: Showed increased population of CD/CD8 cells.  Recommended repeat in a few months RBC antigen testing for Duffy null antigen was positive.  Patient does not have benign ethnic neutropenia  -Discussed the above findings with the patient.  Patient is going out of country for a few months and would like to follow-up with flow cytometry in 6 months. - If flow cytometry is concerning at that time would do a bone marrow biopsy.  Return to clinic in 6 months with labs  Thrombocytopenia (HCC) Thrombocytopenia with slightly low platelet count.  No bleeding history.   Hepatitis panel: Negative Patient has very mild vitamin B12 deficiency  - Start taking oral vitamin B12 1000 mcg daily - Will obtain an ultrasound of abdomen to rule out hepatosplenomegaly   Orders Placed This Encounter  Procedures   US  Abdomen Complete    Standing Status:   Future    Expected Date:   11/27/2023    Expiration Date:   11/26/2024    Reason for Exam (SYMPTOM  OR DIAGNOSIS REQUIRED):   Rule out hepatomegaly    Preferred imaging location?:   [facility]   CBC with Differential/Platelet    Standing Status:   Future    Expected Date:   04/27/2024    Expiration Date:   11/26/2024   Comprehensive metabolic panel with GFR    Standing Status:   Future    Expected Date:   04/27/2024    Expiration Date:   11/26/2024   Copper, serum    Standing Status:   Future    Expected  Date:   04/27/2024    Expiration Date:   11/26/2024   Flow Cytometry, Peripheral Blood (Oncology)    Standing Status:   Future    Expiration Date:   05/29/2024    The total time spent in the appointment was 40 minutes encounter with patients including review of chart and various tests results, discussions about plan of care and coordination of care plan   All questions were answered. The patient knows to call the clinic with any problems, questions or concerns. No barriers to learning was detected.  Eduardo Grade, MD 5/9/20252:50 PM   SUMMARY OF HEMATOLOGIC HISTORY: Leukopenia: RBC typing: Positive.  No benign ethnic neutropenia Flow cytometry: Increased CD/CD8 cells.  Recommended repeat in a few months Thrombocytopenia: Hepatitis panel: Negative Vitamin B12: Less than 400  INTERVAL HISTORY: Caleb Ortiz 73 y.o. male following for leukopenia and thrombocytopenia. Patient was accompanied by his son today.  Patient did bring some records of labs from December 2023 showing a WBC of 3.1, ANC of 1674, platelets of 102.  We discussed the results of the test showing mild vitamin B12 deficiency, intestinal antigen testing being positive meaning no benign anemia, flow cytometry did show some increased CD4/CD8 cells requiring a repeat in a few months.  He reports no symptoms such as fever, chills, or weight loss. He feels generally well and maintains a good appetite but experiences constipation. He  uses Miralax for constipation, though it is not very effective.  He plans to travel to [place] and will be away for several months.   I have reviewed the past medical history, past surgical history, social history and family history with the patient   ALLERGIES:  has no known allergies.  MEDICATIONS:  Current Outpatient Medications  Medication Sig Dispense Refill   glucose blood test strip CHECK BLOOD SUGAR TWICE DAILY     levothyroxine  (SYNTHROID ) 100 MCG tablet Take 100 mcg by  mouth daily.     linagliptin (TRADJENTA) 5 MG TABS tablet Take 5 mg by mouth daily.     losartan -hydrochlorothiazide (HYZAAR) 100-12.5 MG tablet Take 1 tablet by mouth daily.     metFORMIN  (GLUCOPHAGE  XR) 500 MG 24 hr tablet Take 1 tablet (500 mg total) by mouth 2 (two) times daily. 360 tablet 1   rosuvastatin  (CRESTOR ) 10 MG tablet Take 10 mg by mouth at bedtime.     No current facility-administered medications for this visit.     REVIEW OF SYSTEMS:   Constitutional: Denies fevers, chills or night sweats Eyes: Denies blurriness of vision Ears, nose, mouth, throat, and face: Denies mucositis or sore throat Respiratory: Denies cough, dyspnea or wheezes Cardiovascular: Denies palpitation, chest discomfort or lower extremity swelling Gastrointestinal:  Denies nausea, heartburn or change in bowel habits Skin: Denies abnormal skin rashes Lymphatics: Denies new lymphadenopathy or easy bruising Neurological:Denies numbness, tingling or new weaknesses Behavioral/Psych: Mood is stable, no new changes  All other systems were reviewed with the patient and are negative.  PHYSICAL EXAMINATION:   Vitals:   11/27/23 1323  BP: 130/68  Pulse: 66  Resp: 16  Temp: 97.8 F (36.6 C)  SpO2: 98%    GENERAL:alert, no distress and comfortable SKIN: skin color, texture, turgor are normal, no rashes or significant lesions LYMPH:  no palpable lymphadenopathy in the cervical, axillary or inguinal LUNGS: clear to auscultation and percussion with normal breathing effort HEART: regular rate & rhythm and no murmurs and no lower extremity edema ABDOMEN:abdomen soft, non-tender and normal bowel sounds Musculoskeletal:no cyanosis of digits and no clubbing  NEURO: alert & oriented x 3 with fluent speech  LABORATORY DATA:  I have reviewed the data as listed  Lab Results  Component Value Date   WBC 3.8 (L) 11/13/2023   NEUTROABS 2.1 11/13/2023   HGB 14.3 11/13/2023   HCT 43.7 11/13/2023   MCV 95.6  11/13/2023   PLT 110 (L) 11/13/2023       Chemistry      Component Value Date/Time   NA 139 11/13/2023 1137   NA 139 05/29/2021 1103   K 4.1 11/13/2023 1137   CL 101 11/13/2023 1137   CO2 26 11/13/2023 1137   BUN 22 11/13/2023 1137   BUN 8 05/29/2021 1103   CREATININE 0.87 11/13/2023 1137   GLU 110 05/29/2021 0000      Component Value Date/Time   CALCIUM  9.7 11/13/2023 1137   ALKPHOS 38 11/13/2023 1137   AST 20 11/13/2023 1137   ALT 18 11/13/2023 1137   BILITOT 0.9 11/13/2023 1137   BILITOT 0.7 05/29/2021 1103      Latest Reference Range & Units 11/13/23 11:38  Iron 45 - 182 ug/dL 84  UIBC ug/dL 409  TIBC 811 - 914 ug/dL 782  Saturation Ratios 17.9 - 39.5 % 25  Ferritin 24 - 336 ng/mL 194  Folate >5.9 ng/mL 6.7  Vitamin B12 180 - 914 pg/mL 320    Latest Reference  Range & Units 11/13/23 11:37  Hep A Ab, IgM NON REACTIVE  NON REACTIVE  Hepatitis B Surface Ag NON REACTIVE  NON REACTIVE  Hep B Core Ab, IgM NON REACTIVE  NON REACTIVE  HCV Ab NON REACTIVE  NON REACTIVE   Duffy Null antigen testing: Fya                            Positive                  BN   Fyb                            Negative                  BN     Flow cytometry: DIAGNOSIS:  Peripheral blood, flow cytometry: -  An increased population of CD4/CD8 dual positive T cells are present at 11%. -  An elevated population of T/NK cells are also present in approximately 25%. -  No monoclonal B cells are detected.  Note: The dual CD4/CD8 positive T cells is a nonspecific finding and have been seen in normal individuals (typically the elderly) as well as infectious and autoimmune conditions.  The elevated population of T/NK cells could represent a possible large granular lymphocytosis.  Repeat testing to further follow the above populations as well as a send out test for an LGL tube is recommended.  GATING AND PHENOTYPIC ANALYSIS:  Gated population: Flow cytometric immunophenotyping is performed using  antibodies to the antigens listed in the table below. Electronic gates are placed around a cell cluster displaying light scatter properties corresponding to: lymphocytes  Abnormal Cells in gated population: N/A  Phenotype of Abnormal Cells: N/A    RADIOGRAPHIC STUDIES: I have personally reviewed the radiological images as listed and agreed with the findings in the report. None to review

## 2023-11-27 NOTE — Telephone Encounter (Signed)
 Spoke with patient regarding applications mailed on 4/21.  Patient reports he did not receive application in the mail.  I verified mailing address and sent another application

## 2023-11-29 NOTE — Assessment & Plan Note (Signed)
 Patient had papillary thyroid  carcinoma s/p resection in [place] and RAI here at [facility] Being followed by Dr. Monte Antonio  - Continue to follow with Dr. Nida

## 2023-11-29 NOTE — Assessment & Plan Note (Signed)
 Patient has mild leukopenia with no neutropenia or lymphopenia. Flow cytometry: Showed increased population of CD/CD8 cells.  Recommended repeat in a few months RBC antigen testing for Duffy null antigen was positive.  Patient does not have benign ethnic neutropenia  -Discussed the above findings with the patient.  Patient is going out of country for a few months and would like to follow-up with flow cytometry in 6 months. - If flow cytometry is concerning at that time would do a bone marrow biopsy.  Return to clinic in 6 months with labs

## 2023-11-29 NOTE — Assessment & Plan Note (Addendum)
 Thrombocytopenia with slightly low platelet count.  No bleeding history.   Hepatitis panel: Negative Patient has very mild vitamin B12 deficiency  - Start taking oral vitamin B12 1000 mcg daily - Will obtain an ultrasound of abdomen to rule out hepatosplenomegaly

## 2023-12-09 NOTE — Telephone Encounter (Signed)
 PAP: Application for Caleb Ortiz has been submitted to [employer]), via fax

## 2023-12-10 ENCOUNTER — Ambulatory Visit (HOSPITAL_COMMUNITY)
Admission: RE | Admit: 2023-12-10 | Discharge: 2023-12-10 | Disposition: A | Discharge status: Home / Self Care | Source: Ambulatory Visit | Attending: Oncology | Admitting: Oncology

## 2023-12-10 DIAGNOSIS — K8020 Calculus of gallbladder without cholecystitis without obstruction: Secondary | ICD-10-CM | POA: Diagnosis not present

## 2023-12-10 DIAGNOSIS — D696 Thrombocytopenia, unspecified: Secondary | ICD-10-CM | POA: Diagnosis not present

## 2023-12-18 NOTE — Telephone Encounter (Signed)
 PAP: Patient assistance application for Tradjenta has been approved by PAP Companies: [employer] from 12/18/2023 to 07/22/2024. Medication should be delivered to PAP Delivery: Home. For further shipping updates, please contact [employer] ([employer]) at [phone]. Patient ID is: no ID given

## 2023-12-18 NOTE — Progress Notes (Signed)
 Pharmacy Medication Assistance Program Note    12/18/2023  Patient ID: Caleb Ortiz, male   DOB: 1951-03-21, 73 y.o.   MRN: [id number]     11/11/2023  Outreach [employer]  Manufacturer Medication One [employer]  [employer] [facility] Tradjenta  Dose of Tradjenta 5mg   Type of Radiographer, therapeutic Assistance  Date Application Sent to Patient 11/14/2023  Application Items Requested Application;Proof of Income  Date Application Sent to Prescriber 11/11/2023  Name of Prescriber Dr. Clary Crown  Date Application Received From Patient 12/09/2023  Application Items Received From Patient Application;Proof of Income  Date Application Received From Provider 11/19/2023  Date Application Submitted to Manufacturer 12/09/2023  Method Application Sent to Manufacturer Fax  Patient Assistance Determination Approved  Approval Start Date 12/18/2023  Approval End Date 07/22/2024  Patient Notification Method Telephone Call  Telephone Call Outcome Successful     Signature

## 2023-12-27 NOTE — Progress Notes (Signed)
   12/27/2023  Patient ID: Caleb Ortiz, male   DOB: [birth year], 73 y.o.   MRN: [id number]  Contacted patient regarding referral for medication access from Caleb Ortiz, Caleb Demissie, MD . Called patient to verify if Caleb Ortiz was received, he was approved for PAP on 12/18/23. Patient reports receiving medication. Also reports no other medication needs at this time.   Thank you for allowing pharmacy to be a part of this patient's care.    Livia Riffle, PharmD Clinical Pharmacist  [phone]

## 2024-01-13 DIAGNOSIS — I10 Essential (primary) hypertension: Secondary | ICD-10-CM | POA: Diagnosis not present

## 2024-01-13 DIAGNOSIS — E1165 Type 2 diabetes mellitus with hyperglycemia: Secondary | ICD-10-CM | POA: Diagnosis not present

## 2024-04-15 ENCOUNTER — Telehealth: Payer: Self-pay

## 2024-04-15 NOTE — Telephone Encounter (Signed)
 2026 Renewal  PAP: Patient assistance application for Tradjenta through [employer]) has been mailed to pt's home address on file. Provider portion of application will be faxed to provider's office.  Will fax provider protion to Dr. Tesfaye Fanta

## 2024-05-26 ENCOUNTER — Inpatient Hospital Stay

## 2024-06-02 ENCOUNTER — Inpatient Hospital Stay: Admitting: Oncology

## 2024-06-12 NOTE — Telephone Encounter (Signed)
 Spoke with patient regarding 2026 patient assistance application mailed 9/24.  Patient states he mailed directly to company and has been approved

## 2024-06-24 ENCOUNTER — Inpatient Hospital Stay: Attending: Oncology

## 2024-06-24 DIAGNOSIS — D696 Thrombocytopenia, unspecified: Secondary | ICD-10-CM | POA: Insufficient documentation

## 2024-06-24 DIAGNOSIS — C73 Malignant neoplasm of thyroid gland: Secondary | ICD-10-CM | POA: Insufficient documentation

## 2024-06-24 DIAGNOSIS — D72819 Decreased white blood cell count, unspecified: Secondary | ICD-10-CM | POA: Insufficient documentation

## 2024-06-24 DIAGNOSIS — Z79890 Hormone replacement therapy (postmenopausal): Secondary | ICD-10-CM | POA: Diagnosis not present

## 2024-06-24 DIAGNOSIS — E538 Deficiency of other specified B group vitamins: Secondary | ICD-10-CM | POA: Diagnosis not present

## 2024-06-24 DIAGNOSIS — Z79899 Other long term (current) drug therapy: Secondary | ICD-10-CM | POA: Diagnosis not present

## 2024-06-24 LAB — CBC WITH DIFFERENTIAL/PLATELET
Abs Immature Granulocytes: 0.01 K/uL (ref 0.00–0.07)
Basophils Absolute: 0 K/uL (ref 0.0–0.1)
Basophils Relative: 1 %
Eosinophils Absolute: 0 K/uL (ref 0.0–0.5)
Eosinophils Relative: 1 %
HCT: 44.3 % (ref 39.0–52.0)
Hemoglobin: 15.3 g/dL (ref 13.0–17.0)
Immature Granulocytes: 0 %
Lymphocytes Relative: 51 %
Lymphs Abs: 1.6 K/uL (ref 0.7–4.0)
MCH: 32.3 pg (ref 26.0–34.0)
MCHC: 34.5 g/dL (ref 30.0–36.0)
MCV: 93.7 fL (ref 80.0–100.0)
Monocytes Absolute: 0.4 K/uL (ref 0.1–1.0)
Monocytes Relative: 13 %
Neutro Abs: 1.1 K/uL — ABNORMAL LOW (ref 1.7–7.7)
Neutrophils Relative %: 34 %
Platelets: 96 K/uL — ABNORMAL LOW (ref 150–400)
RBC: 4.73 MIL/uL (ref 4.22–5.81)
RDW: 12.8 % (ref 11.5–15.5)
WBC: 3.1 K/uL — ABNORMAL LOW (ref 4.0–10.5)
nRBC: 0 % (ref 0.0–0.2)

## 2024-06-24 LAB — COMPREHENSIVE METABOLIC PANEL WITH GFR
ALT: 28 U/L (ref 0–44)
AST: 28 U/L (ref 15–41)
Albumin: 4.5 g/dL (ref 3.5–5.0)
Alkaline Phosphatase: 63 U/L (ref 38–126)
Anion gap: 13 (ref 5–15)
BUN: 13 mg/dL (ref 8–23)
CO2: 26 mmol/L (ref 22–32)
Calcium: 9.5 mg/dL (ref 8.9–10.3)
Chloride: 102 mmol/L (ref 98–111)
Creatinine, Ser: 0.87 mg/dL (ref 0.61–1.24)
GFR, Estimated: >60 mL/min (ref >60)
Glucose, Bld: 118 mg/dL — ABNORMAL HIGH (ref 70–99)
Potassium: 4.1 mmol/L (ref 3.5–5.1)
Sodium: 140 mmol/L (ref 135–145)
Total Bilirubin: 0.4 mg/dL (ref 0.0–1.2)
Total Protein: 7 g/dL (ref 6.5–8.1)

## 2024-06-25 LAB — COPPER, SERUM: Copper: 113 ug/dL (ref 69–132)

## 2024-06-30 ENCOUNTER — Telehealth: Payer: Self-pay

## 2024-06-30 NOTE — Progress Notes (Signed)
   06/30/2024  Patient ID: Caleb Ortiz, male   DOB: Dec 27, 1950, 73 y.o.   MRN: [id number]  Contacted patient to follow up on Tradjenta PAP re-enrollment. The patient was not in a good area to discuss and requested to give me a call back at his convenience.   I will follow up with him if I do not hear back.   Heather Factor, PharmD Clinical Pharmacist  [phone]

## 2024-07-10 ENCOUNTER — Inpatient Hospital Stay: Admitting: Oncology

## 2024-07-10 ENCOUNTER — Inpatient Hospital Stay

## 2024-07-10 VITALS — BP 119/71 | HR 65 | Temp 97.6°F | Resp 16 | Wt 172.0 lb

## 2024-07-10 DIAGNOSIS — D696 Thrombocytopenia, unspecified: Secondary | ICD-10-CM

## 2024-07-10 DIAGNOSIS — D72819 Decreased white blood cell count, unspecified: Secondary | ICD-10-CM

## 2024-07-10 DIAGNOSIS — C73 Malignant neoplasm of thyroid gland: Secondary | ICD-10-CM

## 2024-07-10 LAB — CBC WITH DIFFERENTIAL/PLATELET
Abs Immature Granulocytes: 0.02 K/uL (ref 0.00–0.07)
Basophils Absolute: 0 K/uL (ref 0.0–0.1)
Basophils Relative: 1 %
Eosinophils Absolute: 0 K/uL (ref 0.0–0.5)
Eosinophils Relative: 1 %
HCT: 41.1 % (ref 39.0–52.0)
Hemoglobin: 14.3 g/dL (ref 13.0–17.0)
Immature Granulocytes: 1 %
Lymphocytes Relative: 31 %
Lymphs Abs: 1.3 K/uL (ref 0.7–4.0)
MCH: 32.1 pg (ref 26.0–34.0)
MCHC: 34.8 g/dL (ref 30.0–36.0)
MCV: 92.4 fL (ref 80.0–100.0)
Monocytes Absolute: 0.3 K/uL (ref 0.1–1.0)
Monocytes Relative: 7 %
Neutro Abs: 2.5 K/uL (ref 1.7–7.7)
Neutrophils Relative %: 59 %
Platelets: 110 K/uL — ABNORMAL LOW (ref 150–400)
RBC: 4.45 MIL/uL (ref 4.22–5.81)
RDW: 12.7 % (ref 11.5–15.5)
WBC: 4.2 K/uL (ref 4.0–10.5)
nRBC: 0 % (ref 0.0–0.2)

## 2024-07-10 NOTE — Progress Notes (Addendum)
 " [facility] at [facility]  HEMATOLOGY FOLLOW-UP VISIT  Fanta, Benita Area, MD  REASON FOR FOLLOW-UP: Leukopenia and thrombocytopenia  ASSESSMENT & PLAN:  Patient is a 73 y.o. 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 Previous benign ethnic neutropenia evaluation negative. Flow cytometry: Showed increased population of CD/CD8 cells. Recommended repeat in a few months   - Ordered repeat blood work including flow cytometry. Will also order T cell rearrangement testing.  - Planned to communicate results once available over phone. - Discussed potential need for bone marrow biopsy if flow cytometry remains abnormal, to be scheduled around travel plans. - Offered referral to a hematologist in [place] for ongoing care if preferred.  Addendum: Flow cytometry with no abnormal T or B lymphocytes. T cell rearrangement study pending at this time. Discussed this with the patient over phone.   Thrombocytopenia Chronic mild thrombocytopenia with stable or slightly decreased platelet counts, asymptomatic, no bleeding or bruising. No acute intervention required. Hepatitis panel: Negative Patient has very mild vitamin B12 deficiency  - Ordered repeat blood work including platelet count for ongoing monitoring. - Planned to communicate results and discuss further management if abnormalities are detected.  Malignant neoplasm of thyroid  gland  Patient had papillary thyroid  carcinoma s/p resection in [place] and RAI here at [facility] Being followed by Dr. Lenis   - Continue to follow with Dr. Lenis    Orders Placed This Encounter  Procedures   CBC with Differential/Platelet    Standing Status:   Future    Number of Occurrences:   1    Expected Date:   07/10/2024    Expiration Date:   10/08/2024   Flow Cytometry,  Peripheral Blood (Oncology)    Standing Status:   Future    Number of Occurrences:   1    Expected Date:   07/10/2024    Expiration Date:   10/08/2024    The total time spent in the appointment was 22 minutes encounter with patients including review of chart and various tests results, discussions about plan of care and coordination of care plan   All questions were answered. The patient knows to call the clinic with any problems, questions or concerns. No barriers to learning was detected.  Mickiel Dry, MD 12/20/20254:28 PM   SUMMARY OF HEMATOLOGIC HISTORY: Leukopenia: RBC typing: Positive.  No benign ethnic neutropenia Flow cytometry: Increased CD/CD8 cells.  Recommended repeat in a few months Thrombocytopenia: Hepatitis panel: Negative Vitamin B12: Less than 400  INTERVAL HISTORY: Discussed the use of AI scribe software for clinical note transcription with the patient, who gave verbal consent to proceed.  History of Present Illness Caleb Ortiz is a 73 year old male with chronic leukopenia and thrombocytopenia who presents for hematology follow-up to evaluate persistent cytopenias.He is accompanied by his daughter today.   He has longstanding leukopenia, with a most recent white blood cell count of 3.1 x10^3/L, and chronic mild thrombocytopenia. Both cytopenias have remained stable or only slightly decreased compared to prior laboratory values. He plans to travel to [place] in early January.  He is currently asymptomatic, denying fever, chills, abnormal bleeding, ecchymosis, abdominal pain, or gastrointestinal bleeding. He reports feeling well overall, with preserved appetite and no recent changes in health status or functional capacity.  I have reviewed the past medical history, past surgical history, social history and family history with the  patient   ALLERGIES:  has no known allergies.  MEDICATIONS:  Current Outpatient Medications  Medication Sig  Dispense Refill   glucose blood test strip CHECK BLOOD SUGAR TWICE DAILY     levothyroxine  (SYNTHROID ) 100 MCG tablet Take 100 mcg by mouth daily.     linagliptin (TRADJENTA) 5 MG TABS tablet Take 5 mg by mouth daily.     losartan -hydrochlorothiazide (HYZAAR) 100-12.5 MG tablet Take 1 tablet by mouth daily.     metFORMIN  (GLUCOPHAGE  XR) 500 MG 24 hr tablet Take 1 tablet (500 mg total) by mouth 2 (two) times daily. 360 tablet 1   rosuvastatin  (CRESTOR ) 10 MG tablet Take 10 mg by mouth at bedtime.     No current facility-administered medications for this visit.    PHYSICAL EXAMINATION:   Vitals:   07/10/24 1315  BP: 119/71  Pulse: 65  Resp: 16  Temp: 97.6 F (36.4 C)  SpO2: 99%     GENERAL:alert, no distress and comfortable SKIN: skin color, texture, turgor are normal, no rashes or significant lesions LYMPH:  no palpable lymphadenopathy in the cervical, axillary or inguinal LUNGS: clear to auscultation and percussion with normal breathing effort HEART: regular rate & rhythm and no murmurs and no lower extremity edema ABDOMEN:abdomen soft, non-tender and normal bowel sounds Musculoskeletal:no cyanosis of digits and no clubbing  NEURO: alert & oriented x 3 with fluent speech  LABORATORY DATA:  I have reviewed the data as listed  Lab Results  Component Value Date   WBC 4.2 07/10/2024   NEUTROABS 2.5 07/10/2024   HGB 14.3 07/10/2024   HCT 41.1 07/10/2024   MCV 92.4 07/10/2024   PLT 110 (L) 07/10/2024       Chemistry      Component Value Date/Time   NA 140 06/24/2024 1222   NA 139 05/29/2021 1103   K 4.1 06/24/2024 1222   CL 102 06/24/2024 1222   CO2 26 06/24/2024 1222   BUN 13 06/24/2024 1222   BUN 8 05/29/2021 1103   CREATININE 0.87 06/24/2024 1222   GLU 110 05/29/2021 0000      Component Value Date/Time   CALCIUM  9.5 06/24/2024 1222   ALKPHOS 63 06/24/2024 1222   AST 28 06/24/2024 1222   ALT 28 06/24/2024 1222   BILITOT 0.4 06/24/2024 1222   BILITOT 0.7  05/29/2021 1103      Latest Reference Range & Units 11/13/23 11:38  Iron 45 - 182 ug/dL 84  UIBC ug/dL 740  TIBC 749 - 549 ug/dL 656  Saturation Ratios 17.9 - 39.5 % 25  Ferritin 24 - 336 ng/mL 194  Folate >5.9 ng/mL 6.7  Vitamin B12 180 - 914 pg/mL 320    Latest Reference Range & Units 11/13/23 11:37  Hep A Ab, IgM NON REACTIVE  NON REACTIVE  Hepatitis B Surface Ag NON REACTIVE  NON REACTIVE  Hep B Core Ab, IgM NON REACTIVE  NON REACTIVE  HCV Ab NON REACTIVE  NON REACTIVE   Duffy Null antigen testing: Fya                            Positive                  BN   Fyb                            Negative  BN     Flow cytometry: DIAGNOSIS:  Peripheral blood, flow cytometry: -  An increased population of CD4/CD8 dual positive T cells are present at 11%. -  An elevated population of T/NK cells are also present in approximately 25%. -  No monoclonal B cells are detected.  Note: The dual CD4/CD8 positive T cells is a nonspecific finding and have been seen in normal individuals (typically the elderly) as well as infectious and autoimmune conditions.  The elevated population of T/NK cells could represent a possible large granular lymphocytosis.  Repeat testing to further follow the above populations as well as a send out test for an LGL tube is recommended.  GATING AND PHENOTYPIC ANALYSIS:  Gated population: Flow cytometric immunophenotyping is performed using antibodies to the antigens listed in the table below. Electronic gates are placed around a cell cluster displaying light scatter properties corresponding to: lymphocytes  Abnormal Cells in gated population: N/A  Phenotype of Abnormal Cells: N/A    RADIOGRAPHIC STUDIES: I have personally reviewed the radiological images as listed and agreed with the findings in the report. None to review  "

## 2024-07-13 LAB — SURGICAL PATHOLOGY

## 2024-07-14 LAB — FLOW CYTOMETRY

## 2024-07-20 ENCOUNTER — Encounter: Payer: Self-pay | Admitting: *Deleted

## 2024-07-30 LAB — MISC LABCORP TEST (SEND OUT): Labcorp test code: 481080

## 2025-01-06 ENCOUNTER — Inpatient Hospital Stay

## 2025-01-13 ENCOUNTER — Inpatient Hospital Stay: Admitting: Oncology
